# Patient Record
Sex: Female | Born: 1940 | Race: Black or African American | Hispanic: No | Smoking: Never smoker
Health system: Southern US, Community
[De-identification: ages and names within clinical notes are randomized; demographics above are authoritative.]

## PROBLEM LIST (undated history)

## (undated) DIAGNOSIS — I10 Essential (primary) hypertension: Secondary | ICD-10-CM

## (undated) DIAGNOSIS — N189 Chronic kidney disease, unspecified: Secondary | ICD-10-CM

## (undated) DIAGNOSIS — F32A Depression, unspecified: Secondary | ICD-10-CM

## (undated) DIAGNOSIS — M1990 Unspecified osteoarthritis, unspecified site: Secondary | ICD-10-CM

## (undated) DIAGNOSIS — E7800 Pure hypercholesterolemia, unspecified: Secondary | ICD-10-CM

## (undated) DIAGNOSIS — K219 Gastro-esophageal reflux disease without esophagitis: Secondary | ICD-10-CM

## (undated) DIAGNOSIS — J45909 Unspecified asthma, uncomplicated: Secondary | ICD-10-CM

## (undated) DIAGNOSIS — F329 Major depressive disorder, single episode, unspecified: Secondary | ICD-10-CM

## (undated) DIAGNOSIS — N2889 Other specified disorders of kidney and ureter: Secondary | ICD-10-CM

## (undated) DIAGNOSIS — C801 Malignant (primary) neoplasm, unspecified: Secondary | ICD-10-CM

---

## 2009-01-16 ENCOUNTER — Emergency Department (HOSPITAL_COMMUNITY): Admission: EM | Admit: 2009-01-16 | Discharge: 2009-01-16 | Payer: Self-pay | Admitting: Emergency Medicine

## 2009-01-28 ENCOUNTER — Emergency Department (HOSPITAL_COMMUNITY): Admission: EM | Admit: 2009-01-28 | Discharge: 2009-01-28 | Payer: Self-pay | Admitting: Emergency Medicine

## 2010-03-19 ENCOUNTER — Emergency Department (HOSPITAL_COMMUNITY): Admission: EM | Admit: 2010-03-19 | Discharge: 2010-03-19 | Payer: Self-pay | Admitting: Emergency Medicine

## 2010-09-08 ENCOUNTER — Encounter: Admission: RE | Admit: 2010-09-08 | Discharge: 2010-09-08 | Payer: Self-pay | Admitting: Family Medicine

## 2011-01-16 ENCOUNTER — Emergency Department (HOSPITAL_COMMUNITY)
Admission: EM | Admit: 2011-01-16 | Discharge: 2011-01-16 | Payer: Self-pay | Source: Home / Self Care | Admitting: Emergency Medicine

## 2011-03-15 LAB — URINE CULTURE

## 2011-03-15 LAB — URINALYSIS, ROUTINE W REFLEX MICROSCOPIC
Bilirubin Urine: NEGATIVE
Leukocytes, UA: NEGATIVE
Nitrite: NEGATIVE
Urobilinogen, UA: 0.2 mg/dL (ref 0.0–1.0)
pH: 7.5 (ref 5.0–8.0)

## 2011-03-15 LAB — POCT CARDIAC MARKERS
CKMB, poc: <1 ng/mL — ABNORMAL LOW (ref 1.0–8.0)
Myoglobin, poc: 53.5 ng/mL (ref 12–200)

## 2011-03-15 LAB — URINE MICROSCOPIC-ADD ON

## 2012-10-02 ENCOUNTER — Other Ambulatory Visit: Payer: Self-pay | Admitting: Family Medicine

## 2012-10-02 DIAGNOSIS — Z1231 Encounter for screening mammogram for malignant neoplasm of breast: Secondary | ICD-10-CM

## 2012-10-23 ENCOUNTER — Ambulatory Visit: Payer: Medicare Other

## 2012-10-27 ENCOUNTER — Emergency Department (INDEPENDENT_AMBULATORY_CARE_PROVIDER_SITE_OTHER)
Admission: EM | Admit: 2012-10-27 | Discharge: 2012-10-27 | Disposition: A | Discharge status: Nursing Home (Includes ICF) | Payer: Medicare Other | Source: Home / Self Care

## 2012-10-27 ENCOUNTER — Emergency Department (HOSPITAL_COMMUNITY): Payer: Medicare Other

## 2012-10-27 ENCOUNTER — Encounter (HOSPITAL_COMMUNITY): Payer: Self-pay | Admitting: Emergency Medicine

## 2012-10-27 ENCOUNTER — Emergency Department (HOSPITAL_COMMUNITY)
Admission: EM | Admit: 2012-10-27 | Discharge: 2012-10-27 | Disposition: A | Discharge status: Home / Self Care | Payer: Medicare Other | Attending: Emergency Medicine | Admitting: Emergency Medicine

## 2012-10-27 DIAGNOSIS — R209 Unspecified disturbances of skin sensation: Secondary | ICD-10-CM | POA: Insufficient documentation

## 2012-10-27 DIAGNOSIS — R0789 Other chest pain: Secondary | ICD-10-CM | POA: Insufficient documentation

## 2012-10-27 DIAGNOSIS — Z79899 Other long term (current) drug therapy: Secondary | ICD-10-CM | POA: Insufficient documentation

## 2012-10-27 DIAGNOSIS — R252 Cramp and spasm: Secondary | ICD-10-CM

## 2012-10-27 DIAGNOSIS — M79609 Pain in unspecified limb: Secondary | ICD-10-CM

## 2012-10-27 DIAGNOSIS — Z7982 Long term (current) use of aspirin: Secondary | ICD-10-CM | POA: Insufficient documentation

## 2012-10-27 DIAGNOSIS — M79601 Pain in right arm: Secondary | ICD-10-CM

## 2012-10-27 DIAGNOSIS — R079 Chest pain, unspecified: Secondary | ICD-10-CM

## 2012-10-27 LAB — CBC WITH DIFFERENTIAL/PLATELET
Basophils Absolute: 0 10*3/uL (ref 0.0–0.1)
Basophils Relative: 0 % (ref 0–1)
Eosinophils Absolute: 0.2 10*3/uL (ref 0.0–0.7)
Eosinophils Relative: 4 % (ref 0–5)
HCT: 37.4 % (ref 36.0–46.0)
Hemoglobin: 12.4 g/dL (ref 12.0–15.0)
Lymphocytes Relative: 50 % — ABNORMAL HIGH (ref 12–46)
Lymphs Abs: 2.7 10*3/uL (ref 0.7–4.0)
MCH: 28 pg (ref 26.0–34.0)
MCHC: 33.2 g/dL (ref 30.0–36.0)
MCV: 84.4 fL (ref 78.0–100.0)
Monocytes Absolute: 0.3 10*3/uL (ref 0.1–1.0)
Monocytes Relative: 5 % (ref 3–12)
Neutro Abs: 2.2 10*3/uL (ref 1.7–7.7)
Neutrophils Relative %: 42 % — ABNORMAL LOW (ref 43–77)
Platelets: 188 10*3/uL (ref 150–400)
RBC: 4.43 MIL/uL (ref 3.87–5.11)
RDW: 13.9 % (ref 11.5–15.5)
WBC: 5.4 10*3/uL (ref 4.0–10.5)

## 2012-10-27 LAB — POCT I-STAT, CHEM 8
HCT: 37 % (ref 36.0–46.0)
Hemoglobin: 12.6 g/dL (ref 12.0–15.0)
TCO2: 27 mmol/L (ref 0–100)

## 2012-10-27 LAB — COMPREHENSIVE METABOLIC PANEL
ALT: 16 U/L (ref 0–35)
Albumin: 4.2 g/dL (ref 3.5–5.2)
Chloride: 103 mEq/L (ref 96–112)
GFR calc Af Amer: >90 mL/min (ref >90)
GFR calc non Af Amer: 89 mL/min — ABNORMAL LOW (ref >90)
Glucose, Bld: 83 mg/dL (ref 70–99)
Sodium: 140 mEq/L (ref 135–145)
Total Protein: 8 g/dL (ref 6.0–8.3)

## 2012-10-27 LAB — POCT I-STAT TROPONIN I

## 2012-10-27 NOTE — ED Provider Notes (Signed)
History     CSN: [id number]  Arrival date & time 10/27/12  1047   None     Chief Complaint  Patient presents with  . Chest Pain    (Consider location/radiation/quality/duration/timing/severity/associated sxs/prior treatment) HPI Comments: 71 year old female with a history of hypertension and GERD he is complaining of numbness and cramping in her hands feet and legs. She also describes tightness, cramping and pain in her neck shoulders and axilla. She describes tightness in her mid and left anterior chest associated with shortness of breath and easy fatigability which are new symptoms for her. She is somewhat of a poor historian in that she seems to have cramping and almost all of her muscles and numbness in her extremities and finds it difficult to differentiate her chest discomfort, shortness of breath and exertional symptoms.   History reviewed. No pertinent past medical history.  History reviewed. No pertinent past surgical history.  No family history on file.  History  Substance Use Topics  . Smoking status: Never Smoker   . Smokeless tobacco: Not on file  . Alcohol Use: No    OB History    Grav Para Term Preterm Abortions TAB SAB Ect Mult Living                  Review of Systems  Constitutional: Positive for activity change and fatigue. Negative for fever.  HENT: Negative.   Eyes: Negative.   Respiratory: Positive for shortness of breath and wheezing.   Cardiovascular: Positive for chest pain. Negative for leg swelling.  Gastrointestinal: Negative for vomiting and blood in stool.  Musculoskeletal: Positive for myalgias. Negative for back pain.  Skin: Negative.   Neurological: Negative.     Allergies  Review of patient's allergies indicates no known allergies.  Home Medications   Current Outpatient Rx  Name  Route  Sig  Dispense  Refill  . ALBUTEROL SULFATE PO   Oral   Take by mouth.         . CYCLOBENZAPRINE HCL 5 MG PO TABS   Oral   Take 5 mg by  mouth 3 (three) times daily as needed.         Marland Kitchen FAMOTIDINE 20 MG PO TABS   Oral   Take 20 mg by mouth 2 (two) times daily.         Marland Kitchen DIOVAN PO   Oral   Take by mouth.           BP 137/77  Pulse 72  Temp 98.3 F (36.8 C) (Oral)  Resp 18  SpO2 100%  Physical Exam  Constitutional: She is oriented to person, place, and time. She appears well-developed and well-nourished. No distress.  HENT:  Head: Normocephalic and atraumatic.  Eyes: EOM are normal. Pupils are equal, round, and reactive to light.  Neck: Normal range of motion. Neck supple.  Cardiovascular: Normal rate, regular rhythm and normal heart sounds.   Pulmonary/Chest: Effort normal and breath sounds normal. No respiratory distress.       Bilateral expiratory wheezes but no crackles.  Abdominal: Soft. There is no tenderness.  Musculoskeletal: Normal range of motion.  Lymphadenopathy:    She has no cervical adenopathy.  Neurological: She is alert and oriented to person, place, and time. No cranial nerve deficit.  Skin: Skin is warm and dry.  Psychiatric: She has a normal mood and affect.    ED Course  Procedures (including critical care time)  Labs Reviewed - No data to display No results  found.   1. Chest pain   2. Paresthesia and pain of both upper extremities   3. Cramps, muscle, general       MDM  Will send to the emergency department by shuttle. She stable for now and has had symptoms for several days, up to 2 weeks. It is difficult to dissect her any complaints of paresthesias and cramping in association with tightness in the chest, shortness of breath and easy fatigability.         Hayden Rasmussen, NP 10/27/12 1220

## 2012-10-27 NOTE — ED Notes (Signed)
Pt c/o chest discomfort x2 days... Says the pain starts at [facility] and will radiates towards breast/chest area... Sx include: SOB... Also, c/o cramps/numbness of feet and hands x2 weeks... Denies: fevers, vomiting, nauseas, diarrhea, headaches, blurry visions, edema... Pt is alert w/no signs of distress.

## 2012-10-27 NOTE — ED Notes (Signed)
EKG performed at 1151 hours - EKG shows 1252 as time had not been changed from daylight savings time in machine.

## 2012-10-27 NOTE — ED Provider Notes (Signed)
History     CSN: [id number]  Arrival date & time 10/27/12  1244   First MD Initiated Contact with Patient 10/27/12 1706      No chief complaint on file.  chief complaint numbness,, chest pain, muscle cramps  (Consider location/radiation/quality/duration/timing/severity/associated sxs/prior treatment) HPI Patient complains of numbness in both hands and both feet for approximately 3 weeks complains of substernal chest pain constant for 3 weeks with a mild pleuritic component no shortness of breath no nausea no sweatiness has been seen by her primary care physician Dr.Glen the same complaint .she reports that she was told it is not her heart. Pain is nonexertional no treatment prior to coming here [facility] earlier today sent here for further evaluation. No other associated symptoms no treatment prior to coming here nothing makes symptoms better or worse No past medical history on file. Hypercholesterolemia hypertension No past surgical history on file. Tubal ligation No family history on file.  History  Substance Use Topics  . Smoking status: Never Smoker   . Smokeless tobacco: Not on file  . Alcohol Use: No    OB History    Grav Para Term Preterm Abortions TAB SAB Ect Mult Living                  Review of Systems  Cardiovascular: Positive for chest pain.  Neurological: Positive for numbness.  All other systems reviewed and are negative.    Allergies  Peanuts  Home Medications   Current Outpatient Rx  Name  Route  Sig  Dispense  Refill  . ACYCLOVIR 200 MG PO CAPS   Oral   Take 200 mg by mouth daily.         . ALBUTEROL SULFATE HFA 108 (90 BASE) MCG/ACT IN AERS   Inhalation   Inhale 2 puffs into the lungs every 6 (six) hours as needed. For shortness of breath/wheezing         . ASPIRIN EC 81 MG PO TBEC   Oral   Take 81 mg by mouth daily.         . ATORVASTATIN CALCIUM 40 MG PO TABS   Oral   Take 40 mg by mouth daily.        . CYCLOBENZAPRINE HCL 5 MG PO TABS   Oral   Take 5 mg by mouth daily as needed. For muscle spasms         . FAMOTIDINE 20 MG PO TABS   Oral   Take 20 mg by mouth daily.          Marland Kitchen VALSARTAN 160 MG PO TABS   Oral   Take 160 mg by mouth daily.           BP 132/73  Pulse 66  Temp 97.8 F (36.6 C) (Oral)  Resp 20  SpO2 97%  Physical Exam  Nursing note and vitals reviewed. Constitutional: She appears well-developed and well-nourished.  HENT:  Head: Normocephalic and atraumatic.  Eyes: Conjunctivae normal are normal. Pupils are equal, round, and reactive to light.  Neck: Neck supple. No tracheal deviation present. No thyromegaly present.  Cardiovascular: Normal rate and regular rhythm.   No murmur heard. Pulmonary/Chest: Effort normal and breath sounds normal.  Abdominal: Soft. Bowel sounds are normal. She exhibits no distension. There is no tenderness.  Musculoskeletal: Normal range of motion. She exhibits no edema and no tenderness.  Neurological: She is alert. Coordination normal.  Skin: Skin is warm and dry.  No rash noted.  Psychiatric: She has a normal mood and affect.    ED Course  Procedures (including critical care time)  Labs Reviewed  CBC WITH DIFFERENTIAL - Abnormal; Notable for the following:    Neutrophils Relative 42 (*)     Lymphocytes Relative 50 (*)     All other components within normal limits  COMPREHENSIVE METABOLIC PANEL - Abnormal; Notable for the following:    GFR calc non Af Amer 89 (*)     All other components within normal limits  POCT I-STAT TROPONIN I   Dg Chest 2 View  10/27/2012  *RADIOLOGY REPORT*  Clinical Data: Lateral chest pain  CHEST - 2 VIEW  Comparison: 01/16/2011  Findings: Heart size is normal.  Mediastinal shadows are normal. The lungs are clear except for mild scarring in the right middle lobe.  No infiltrate, mass, effusion or collapse.  No significant bony finding.  IMPRESSION: No active disease.  Mild scarring right  middle lobe.   Original Report Authenticated By: Paulina Fusi, M.D.      No diagnosis found.    Date: 10/27/2012  Rate: 55  Rhythm: sinus bradycardia  QRS Axis: normal  Intervals: normal  ST/T Wave abnormalities: nonspecific T wave changes  Conduction Disutrbances:none  Narrative Interpretation:   Old EKG Reviewed: EKG from today at 12:51 PM shows normal sinus rhythm 65 beats per minute otherwise unchanged from today's EKG  Results for orders placed during the hospital encounter of 10/27/12  CBC WITH DIFFERENTIAL      Component Value Range   WBC 5.4  4.0 - 10.5 K/uL   RBC 4.43  3.87 - 5.11 MIL/uL   Hemoglobin 12.4  12.0 - 15.0 g/dL   HCT 16.1  09.6 - 04.5 %   MCV 84.4  78.0 - 100.0 fL   MCH 28.0  26.0 - 34.0 pg   MCHC 33.2  30.0 - 36.0 g/dL   RDW 40.9  81.1 - 91.4 %   Platelets 188  150 - 400 K/uL   Neutrophils Relative 42 (*) 43 - 77 %   Neutro Abs 2.2  1.7 - 7.7 K/uL   Lymphocytes Relative 50 (*) 12 - 46 %   Lymphs Abs 2.7  0.7 - 4.0 K/uL   Monocytes Relative 5  3 - 12 %   Monocytes Absolute 0.3  0.1 - 1.0 K/uL   Eosinophils Relative 4  0 - 5 %   Eosinophils Absolute 0.2  0.0 - 0.7 K/uL   Basophils Relative 0  0 - 1 %   Basophils Absolute 0.0  0.0 - 0.1 K/uL  COMPREHENSIVE METABOLIC PANEL      Component Value Range   Sodium 140  135 - 145 mEq/L   Potassium 3.8  3.5 - 5.1 mEq/L   Chloride 103  96 - 112 mEq/L   CO2 29  19 - 32 mEq/L   Glucose, Bld 83  70 - 99 mg/dL   BUN 9  6 - 23 mg/dL   Creatinine, Ser 7.82  0.50 - 1.10 mg/dL   Calcium 95.6  8.4 - 21.3 mg/dL   Total Protein 8.0  6.0 - 8.3 g/dL   Albumin 4.2  3.5 - 5.2 g/dL   AST 25  0 - 37 U/L   ALT 16  0 - 35 U/L   Alkaline Phosphatase 80  39 - 117 U/L   Total Bilirubin 0.4  0.3 - 1.2 mg/dL   GFR calc non Af Amer 89 (*) >90  mL/min   GFR calc Af Amer >90  >90 mL/min  POCT I-STAT TROPONIN I      Component Value Range   Troponin i, poc 0.00  0.00 - 0.08 ng/mL   Comment 3           POCT I-STAT TROPONIN I       Component Value Range   Troponin i, poc 0.01  0.00 - 0.08 ng/mL   Comment 3            Dg Chest 2 View  10/27/2012  *RADIOLOGY REPORT*  Clinical Data: Lateral chest pain  CHEST - 2 VIEW  Comparison: 01/16/2011  Findings: Heart size is normal.  Mediastinal shadows are normal. The lungs are clear except for mild scarring in the right middle lobe.  No infiltrate, mass, effusion or collapse.  No significant bony finding.  IMPRESSION: No active disease.  Mild scarring right middle lobe.   Original Report Authenticated By: Paulina Fusi, M.D.    . 6:55 PM states "I feel well" MDM  Chest pain highly atypical for cardiac etiology. Has been constant for several weeks. Plan Tylenol as needed for pain followup Dr. Algernon Huxley DX is noncardiac chest pain        Doug Sou, MD 10/27/12 1902

## 2012-10-27 NOTE — ED Notes (Signed)
Cp x 2 days siffness in and numbness in hands and feet x 2 weeks  Sob  Get tired easy she staes  Went to [facility] and was sent here for further tests had ekg at [facility]

## 2012-10-28 NOTE — ED Provider Notes (Signed)
Medical screening examination/treatment/procedure(s) were performed by resident physician or non-physician practitioner and as supervising physician I was immediately available for consultation/collaboration.   KINDL,JAMES DOUGLAS MD.    James D Kindl, MD 10/28/12 0918 

## 2012-10-30 ENCOUNTER — Ambulatory Visit
Admission: RE | Admit: 2012-10-30 | Discharge: 2012-10-30 | Disposition: A | Discharge status: Home / Self Care | Payer: Medicare Other | Source: Ambulatory Visit | Attending: Family Medicine | Admitting: Family Medicine

## 2012-10-30 DIAGNOSIS — Z1231 Encounter for screening mammogram for malignant neoplasm of breast: Secondary | ICD-10-CM

## 2013-02-16 ENCOUNTER — Emergency Department (INDEPENDENT_AMBULATORY_CARE_PROVIDER_SITE_OTHER): Payer: Medicare Other

## 2013-02-16 ENCOUNTER — Emergency Department (INDEPENDENT_AMBULATORY_CARE_PROVIDER_SITE_OTHER)
Admission: EM | Admit: 2013-02-16 | Discharge: 2013-02-16 | Disposition: A | Discharge status: Home / Self Care | Payer: Medicaid Other | Source: Home / Self Care | Attending: Emergency Medicine | Admitting: Emergency Medicine

## 2013-02-16 ENCOUNTER — Encounter (HOSPITAL_COMMUNITY): Payer: Self-pay

## 2013-02-16 DIAGNOSIS — J45901 Unspecified asthma with (acute) exacerbation: Secondary | ICD-10-CM

## 2013-02-16 HISTORY — DX: Pure hypercholesterolemia, unspecified: E78.00

## 2013-02-16 HISTORY — DX: Unspecified asthma, uncomplicated: J45.909

## 2013-02-16 HISTORY — DX: Essential (primary) hypertension: I10

## 2013-02-16 MED ORDER — DEXAMETHASONE 4 MG PO TABS: 16.0000 mg | ORAL_TABLET | Freq: Once | ORAL | Status: DC

## 2013-02-16 MED ORDER — DEXAMETHASONE 4 MG PO TABS
ORAL_TABLET | ORAL | Status: AC
  Filled 2013-02-16: qty 4

## 2013-02-16 MED ORDER — DEXAMETHASONE 4 MG PO TABS
16.0000 mg | ORAL_TABLET | Freq: Once | ORAL | Status: AC
  Administered 2013-02-16: 16 mg via ORAL

## 2013-02-16 MED ORDER — ALBUTEROL SULFATE (5 MG/ML) 0.5% IN NEBU
INHALATION_SOLUTION | RESPIRATORY_TRACT | Status: AC
  Filled 2013-02-16: qty 1

## 2013-02-16 MED ORDER — ALBUTEROL SULFATE (5 MG/ML) 0.5% IN NEBU
5.0000 mg | INHALATION_SOLUTION | Freq: Once | RESPIRATORY_TRACT | Status: AC
  Administered 2013-02-16: 5 mg via RESPIRATORY_TRACT

## 2013-02-16 MED ORDER — IPRATROPIUM BROMIDE 0.02 % IN SOLN
0.5000 mg | Freq: Once | RESPIRATORY_TRACT | Status: AC
  Administered 2013-02-16: 0.5 mg via RESPIRATORY_TRACT

## 2013-02-16 NOTE — ED Provider Notes (Signed)
History     CSN: [id number]  Arrival date & time 02/16/13  1114   None     Chief Complaint  Patient presents with  . Shoulder Pain    (Consider location/radiation/quality/duration/timing/severity/associated sxs/prior treatment) Patient is a 72 y.o. female presenting with shoulder pain. The history is provided by the patient.  Shoulder Pain This is a new problem. Episode onset: 5 days ago. The problem occurs constantly. The problem has been gradually worsening. Associated symptoms include chest pain and shortness of breath. Exacerbated by: using arm/shoulder, activity/movement. Nothing relieves the symptoms.    Past Medical History  Diagnosis Date  . Asthma   . High cholesterol   . Hypertension     History reviewed. No pertinent past surgical history.  History reviewed. No pertinent family history.  History  Substance Use Topics  . Smoking status: Never Smoker   . Smokeless tobacco: Not on file  . Alcohol Use: No    OB History   Grav Para Term Preterm Abortions TAB SAB Ect Mult Living                  Review of Systems  Constitutional: Negative for fever and chills.  HENT: Negative for neck pain.   Respiratory: Positive for cough, shortness of breath and wheezing.   Cardiovascular: Positive for chest pain.  Musculoskeletal:       Shoulder and back pain on L side  Skin: Negative for color change and rash.    Allergies  Peanuts  Home Medications   Current Outpatient Rx  Name  Route  Sig  Dispense  Refill  . acyclovir (ZOVIRAX) 200 MG capsule   Oral   Take 200 mg by mouth daily.         Marland Kitchen albuterol (PROVENTIL HFA;VENTOLIN HFA) 108 (90 BASE) MCG/ACT inhaler   Inhalation   Inhale 2 puffs into the lungs every 6 (six) hours as needed. For shortness of breath/wheezing         . aspirin EC 81 MG tablet   Oral   Take 81 mg by mouth daily.         Marland Kitchen atorvastatin (LIPITOR) 40 MG tablet   Oral   Take 40 mg by mouth daily.         .  cyclobenzaprine (FLEXERIL) 5 MG tablet   Oral   Take 5 mg by mouth daily as needed. For muscle spasms         . dexamethasone (DECADRON) 4 MG tablet   Oral   Take 4 tablets (16 mg total) by mouth once. Take all 4 pills morning of 02/17/2013   4 tablet   0   . famotidine (PEPCID) 20 MG tablet   Oral   Take 20 mg by mouth daily.          . valsartan (DIOVAN) 160 MG tablet   Oral   Take 160 mg by mouth daily.           BP 136/80  Pulse 73  Temp(Src) 98.7 F (37.1 C) (Oral)  Resp 16  SpO2 98%  Physical Exam  Constitutional: She appears well-developed and well-nourished. No distress.  Cardiovascular: Normal rate and regular rhythm.   Pulmonary/Chest: No respiratory distress. She has wheezes.    Diffuse wheezing  Musculoskeletal:       Right shoulder: Normal.       Left shoulder: She exhibits tenderness. She exhibits normal range of motion, no bony tenderness, no swelling, no crepitus, no deformity and  no spasm.       Left elbow: Normal.       Cervical back: Normal.       Thoracic back: Normal.       Arms: Neurological: Gait normal.  Skin: Skin is warm, dry and intact. No rash noted.    ED Course  Procedures (including critical care time)  Labs Reviewed - No data to display Dg Chest 2 View  02/16/2013  *RADIOLOGY REPORT*  Clinical Data: Cough, chest/left shoulder pain  CHEST - 2 VIEW  Comparison: 10/27/2012  Findings: Lungs are essentially clear. No pleural effusion or pneumothorax.  Cardiomediastinal silhouette is within normal limits.  Mild degenerative changes of the visualized thoracolumbar spine.  IMPRESSION: No evidence of acute cardiopulmonary disease.   Original Report Authenticated By: Charline Bills, M.D.      1. Asthma exacerbation       MDM  Pt given albuterol and atrovent nebs and experienced significant reduction in original symptoms.  2nd dose of albuterol eliminated original pain symptoms, pt is now pain free.  Pt reports feeling completely  better, but still has wheezing on auscultation.  It seems pt is not using albuterol inhaler very often because she only can get one per month on medicaid, and can't afford to pay for it herself, so she uses it sparingly.  Discussed need for regular use of albuterol when having asthma symptoms. Pt to f/u with pcp next week. Given dose of dexamethasone here in [facility] and rx for 2nd dose tomorrow.         Cathlyn Parsons, NP 02/16/13 2018

## 2013-02-16 NOTE — ED Notes (Signed)
C/o pain in her left shoulder, left chest wall for several days; NAD , no changes in syx, no known injury; pain sometimes extends into her left side of neck; w/d/color god, NAD

## 2013-02-17 NOTE — ED Provider Notes (Signed)
Medical screening examination/treatment/procedure(s) were performed by resident physician or non-physician practitioner and as supervising physician I was immediately available for consultation/collaboration.   KINDL,JAMES DOUGLAS MD.   James D Kindl, MD 02/17/13 1117 

## 2013-09-26 ENCOUNTER — Emergency Department (INDEPENDENT_AMBULATORY_CARE_PROVIDER_SITE_OTHER)
Admission: EM | Admit: 2013-09-26 | Discharge: 2013-09-26 | Disposition: A | Discharge status: Home / Self Care | Payer: Medicare Other | Source: Home / Self Care | Attending: Emergency Medicine | Admitting: Emergency Medicine

## 2013-09-26 ENCOUNTER — Encounter (HOSPITAL_COMMUNITY): Payer: Self-pay | Admitting: Emergency Medicine

## 2013-09-26 DIAGNOSIS — R599 Enlarged lymph nodes, unspecified: Secondary | ICD-10-CM

## 2013-09-26 DIAGNOSIS — R591 Generalized enlarged lymph nodes: Secondary | ICD-10-CM

## 2013-09-26 DIAGNOSIS — G629 Polyneuropathy, unspecified: Secondary | ICD-10-CM

## 2013-09-26 DIAGNOSIS — R252 Cramp and spasm: Secondary | ICD-10-CM

## 2013-09-26 DIAGNOSIS — N3000 Acute cystitis without hematuria: Secondary | ICD-10-CM

## 2013-09-26 DIAGNOSIS — G589 Mononeuropathy, unspecified: Secondary | ICD-10-CM

## 2013-09-26 LAB — POCT I-STAT, CHEM 8
Calcium, Ion: 1.25 mmol/L (ref 1.13–1.30)
Creatinine, Ser: 0.8 mg/dL (ref 0.50–1.10)
Glucose, Bld: 95 mg/dL (ref 70–99)
Hemoglobin: 12.2 g/dL (ref 12.0–15.0)
Sodium: 141 mEq/L (ref 135–145)
TCO2: 29 mmol/L (ref 0–100)

## 2013-09-26 MED ORDER — CEPHALEXIN 500 MG PO CAPS: 500.0000 mg | ORAL_CAPSULE | Freq: Three times a day (TID) | ORAL | Status: DC

## 2013-09-26 MED ORDER — TRAMADOL HCL 50 MG PO TABS: 100.0000 mg | ORAL_TABLET | Freq: Three times a day (TID) | ORAL | Status: DC | PRN

## 2013-09-26 NOTE — ED Provider Notes (Signed)
Chief Complaint:   Chief Complaint  Patient presents with  . Urinary Frequency    History of Present Illness:   Regina [name] is a 72 year old female who comes in today with a multiplicity of complaints: A knot on her right neck, muscle cramps, excessive hunger and thirst, burning and stinging all over, and urinary symptoms. She notes a one-month history of a mildly tender lump at the hairline in her right occipital area. This is tender to touch. She has not had any lesions of the scalp, neck, or head. She denies any lymphadenopathy anywhere else. She's had no intraoral lesions, nasal congestion, drainage, fever, chills, sweats, or weight loss. She also notes a long-standing history of intermittent cramps in her extremities. These can come on with no precipitating factors at all. They come on day and night. She also notes excessive thirst and excessive hunger. She's not lost any weight. She denies any swelling of extremities, numbness, tingling, or weakness. She does note burning and stinging all over from her head to her toe. She feels like there is something under her skin. For the past 2 weeks she's had frequent urination, dysuria, and urinary order. She denies any blood in the urine, lower bowel or lower back pain, nausea or vomiting. The patient thinks she had a urinary tract infection a long time ago.  Review of Systems:  Other than noted above, the patient denies any of the following symptoms. Systemic:  No fever, chills, sweats, fatigue, myalgias, headache, or anorexia. Eye:  No redness, pain or drainage. ENT:  No earache, nasal congestion, rhinorrhea, sinus pressure, or sore throat. Lungs:  No cough, sputum production, wheezing, shortness of breath.  Cardiovascular:  No chest pain, palpitations, or syncope. GI:  No nausea, vomiting, abdominal pain or diarrhea. GU:  No dysuria, frequency, or hematuria. Skin:  No rash or pruritis.  PMFSH:  Past medical history, family history, social  history, meds, and allergies were reviewed.  She's followed by Dr. Parke Simmers. She has no medication allergies. She has high blood pressure, elevated cholesterol, asthma, osteopenia. She takes albuterol, aspirin, a Darvocet and, cyclobenzaprine, Pepcid, and Diovan.  Physical Exam:   Vital signs:  BP 137/64  Pulse 68  Temp(Src) 98.7 F (37.1 C) (Oral)  Resp 18  SpO2 100% General:  Alert, in no distress. Eye:  PERRL, full EOMs.  Lids and conjunctivas were normal. ENT:  TMs and canals were normal, without erythema or inflammation.  Nasal mucosa was clear and uncongested, without drainage.  Mucous membranes were moist.  Pharynx was clear, without exudate or drainage.  There were no oral ulcerations or lesions. Neck:  Supple, there was a 8 mm, soft, flat, mildly tender lymph node at the hairline in the right occipital area. No lesions were noted on his scalp, head, her face. No lymphadenopathy in any of the other lymph node areas. Lungs:  No respiratory distress.  Lungs were clear to auscultation, without wheezes, rales or rhonchi.  Breath sounds were clear and equal bilaterally. Heart:  Regular rhythm, without gallops, murmers or rubs. Abdomen:  Soft, flat, and non-tender to palpation.  No hepatosplenomagaly or mass. Skin:  Clear, warm, and dry, without rash or lesions.  Labs:   Results for orders placed during the hospital encounter of 09/26/13  POCT I-STAT, CHEM 8      Result Value Range   Sodium 141  135 - 145 mEq/L   Potassium 3.7  3.5 - 5.1 mEq/L   Chloride 101  96 - 112  mEq/L   BUN 13  6 - 23 mg/dL   Creatinine, Ser 9.14  0.50 - 1.10 mg/dL   Glucose, Bld 95  70 - 99 mg/dL   Calcium, Ion 7.82  9.56 - 1.30 mmol/L   TCO2 29  0 - 100 mmol/L   Hemoglobin 12.2  12.0 - 15.0 g/dL   HCT 21.3  08.6 - 57.8 %    A urinalysis was obtained which shows trace hemoglobin. Her urine was cultured.  Assessment:  The primary encounter diagnosis was Acute cystitis. Diagnoses of Muscle cramps, Neuropathy,  and Lymphadenopathy were also pertinent to this visit.  She may have acute cystitis, although her urine is not very striking today. A culture is pending. We'll go ahead and treat with cephalexin. The knot on her neck appears to be a reactive lymph node. We'll treat with antibiotics and have her followup with Dr. Annalee Genta. No obvious cause for the cramping and other symptoms that she's having. Needs followup with her primary care physician.  Plan:   1.  Meds:  The following meds were prescribed:   Discharge Medication List as of 09/26/2013  2:23 PM    START taking these medications   Details  cephALEXin (KEFLEX) 500 MG capsule Take 1 capsule (500 mg total) by mouth 3 (three) times daily., Starting 09/26/2013, Until Discontinued, Normal    traMADol (ULTRAM) 50 MG tablet Take 2 tablets (100 mg total) by mouth every 8 (eight) hours as needed for pain., Starting 09/26/2013, Until Discontinued, Normal        2.  Patient Education/Counseling:  The patient was given appropriate handouts, self care instructions, and instructed in symptomatic relief.   3.  Follow up:  The patient was told to follow up if no better in 3 to 4 days, if becoming worse in any way, and given some red flag symptoms such as fever, persistent vomiting, or severe pain which would prompt immediate return.  Follow up with Dr. Annalee Genta for the lymphadenopathy and with Dr. Parke Simmers for all of her other symptoms.         Reuben Likes, MD 09/26/13 [phone]

## 2013-09-26 NOTE — ED Notes (Signed)
Chart review.

## 2013-09-26 NOTE — ED Notes (Signed)
Pt  Has  Multiple   Complaints       To  Include  Frequent  Urination         pain r  Arm   And  r  Side  With  sm   Knots  On back of  Head    In the   Scalp  Line area         She  Ambulated to  Room    With a  Steady  Fluid  Gait   Sitting upright  On exam table speaking in  Complete  sentances

## 2013-09-27 ENCOUNTER — Other Ambulatory Visit: Payer: Self-pay

## 2013-09-27 DIAGNOSIS — Z1231 Encounter for screening mammogram for malignant neoplasm of breast: Secondary | ICD-10-CM

## 2013-09-27 LAB — POCT URINALYSIS DIP (DEVICE)
Bilirubin Urine: NEGATIVE
Leukocytes, UA: NEGATIVE
Nitrite: NEGATIVE
Protein, ur: NEGATIVE mg/dL
Urobilinogen, UA: 0.2 mg/dL (ref 0.0–1.0)
pH: 8.5 — ABNORMAL HIGH (ref 5.0–8.0)

## 2013-09-27 LAB — URINE CULTURE: Culture: NO GROWTH

## 2013-11-01 ENCOUNTER — Ambulatory Visit
Admission: RE | Admit: 2013-11-01 | Discharge: 2013-11-01 | Disposition: A | Discharge status: Home / Self Care | Payer: Medicare Other | Source: Ambulatory Visit

## 2013-11-01 DIAGNOSIS — Z1231 Encounter for screening mammogram for malignant neoplasm of breast: Secondary | ICD-10-CM

## 2014-01-02 ENCOUNTER — Ambulatory Visit: Payer: Medicare Other

## 2014-01-11 ENCOUNTER — Ambulatory Visit: Payer: Medicare Other | Admitting: Internal Medicine

## 2014-03-01 ENCOUNTER — Ambulatory Visit: Payer: Medicare Other | Attending: Internal Medicine | Admitting: Internal Medicine

## 2014-03-01 ENCOUNTER — Encounter: Payer: Self-pay | Admitting: Internal Medicine

## 2014-03-01 VITALS — BP 128/74 | HR 77 | Temp 98.0°F | Resp 17 | Ht 62.0 in | Wt 151.0 lb

## 2014-03-01 DIAGNOSIS — E785 Hyperlipidemia, unspecified: Secondary | ICD-10-CM

## 2014-03-01 DIAGNOSIS — Z7982 Long term (current) use of aspirin: Secondary | ICD-10-CM | POA: Insufficient documentation

## 2014-03-01 DIAGNOSIS — E7800 Pure hypercholesterolemia, unspecified: Secondary | ICD-10-CM | POA: Insufficient documentation

## 2014-03-01 DIAGNOSIS — M8580 Other specified disorders of bone density and structure, unspecified site: Secondary | ICD-10-CM

## 2014-03-01 DIAGNOSIS — D649 Anemia, unspecified: Secondary | ICD-10-CM

## 2014-03-01 DIAGNOSIS — M949 Disorder of cartilage, unspecified: Secondary | ICD-10-CM

## 2014-03-01 DIAGNOSIS — M899 Disorder of bone, unspecified: Secondary | ICD-10-CM

## 2014-03-01 DIAGNOSIS — Z139 Encounter for screening, unspecified: Secondary | ICD-10-CM

## 2014-03-01 DIAGNOSIS — I10 Essential (primary) hypertension: Secondary | ICD-10-CM

## 2014-03-01 DIAGNOSIS — Z1211 Encounter for screening for malignant neoplasm of colon: Secondary | ICD-10-CM

## 2014-03-01 DIAGNOSIS — J45909 Unspecified asthma, uncomplicated: Secondary | ICD-10-CM

## 2014-03-01 DIAGNOSIS — Z8619 Personal history of other infectious and parasitic diseases: Secondary | ICD-10-CM

## 2014-03-01 LAB — ANEMIA PANEL
%SAT: 16 % — ABNORMAL LOW (ref 20–55)
ABS RETIC: 40.6 10*3/uL (ref 19.0–186.0)
Ferritin: 192 ng/mL (ref 10–291)
Folate: >20 ng/mL
IRON: 49 ug/dL (ref 42–145)
RBC.: 4.51 MIL/uL (ref 3.87–5.11)
RETIC CT PCT: 0.9 % (ref 0.4–2.3)
TIBC: 309 ug/dL (ref 250–470)
UIBC: 260 ug/dL (ref 125–400)
Vitamin B-12: 562 pg/mL (ref 211–911)

## 2014-03-01 MED ORDER — ACYCLOVIR 200 MG PO CAPS: 200.0000 mg | ORAL_CAPSULE | Freq: Every day | ORAL | Status: DC

## 2014-03-01 MED ORDER — ATORVASTATIN CALCIUM 40 MG PO TABS: 40.0000 mg | ORAL_TABLET | Freq: Every day | ORAL | Status: DC

## 2014-03-01 NOTE — Progress Notes (Signed)
Patient Demographics  Regina [name], is a 73 y.o. female  JWJ:[id number]  [id number]:[id number]  DOB - 04-Feb-1941  CC:  Chief Complaint  Patient presents with  . Establish Care       HPI: Regina [name] is a 73 y.o. female here today to establish medical care. Patient has history of hypertension, asthma, osteopenia she is taking vitamin D and calcium supplement, as per patient she was prescribed Fosamax in the past but her insurance did not cover, as per patient she also history of anemia since childhood, she denies any bleeding, 10 years ago she had colonoscopy done, also history of hyperlipidemia has not been taking her Lipitor for the last few months. Patient is requesting refill on the medications, history of herpes as per patient she was prescribed acyclovir and needs a refill on the medication.  Patient has No headache, No chest pain, No abdominal pain - No Nausea, No new weakness tingling or numbness, No Cough - SOB.  Allergies  Allergen Reactions  . Peanuts [Peanut Oil]    Past Medical History  Diagnosis Date  . Asthma   . High cholesterol   . Hypertension    Current Outpatient Prescriptions on File Prior to Visit  Medication Sig Dispense Refill  . albuterol (PROVENTIL HFA;VENTOLIN HFA) 108 (90 BASE) MCG/ACT inhaler Inhale 2 puffs into the lungs every 6 (six) hours as needed. For shortness of breath/wheezing      . aspirin EC 81 MG tablet Take 81 mg by mouth daily.      . cyclobenzaprine (FLEXERIL) 5 MG tablet Take 5 mg by mouth daily as needed. For muscle spasms      . dexamethasone (DECADRON) 4 MG tablet Take 4 tablets (16 mg total) by mouth once. Take all 4 pills morning of 02/17/2013  4 tablet  0  . famotidine (PEPCID) 20 MG tablet Take 20 mg by mouth daily.       . traMADol (ULTRAM) 50 MG tablet Take 2 tablets (100 mg total) by mouth every 8 (eight) hours as needed for pain.  30 tablet  0  . valsartan (DIOVAN) 160 MG tablet Take 160 mg by mouth daily.      . cephALEXin  (KEFLEX) 500 MG capsule Take 1 capsule (500 mg total) by mouth 3 (three) times daily.  30 capsule  0   No current facility-administered medications on file prior to visit.   Family History  Problem Relation Age of Onset  . Heart disease Maternal Grandmother   . Asthma Maternal Grandfather   . Heart disease Maternal Grandfather   . Cancer Mother   . Diabetes Father    History   Social History  . Marital Status: Single    Spouse Name: N/A    Number of Children: N/A  . Years of Education: N/A   Occupational History  . Not on file.   Social History Main Topics  . Smoking status: Never Smoker   . Smokeless tobacco: Not on file  . Alcohol Use: No  . Drug Use: No  . Sexual Activity: Not on file   Other Topics Concern  . Not on file   Social History Narrative  . No narrative on file    Review of Systems: Constitutional: Negative for fever, chills, diaphoresis, activity change, appetite change and fatigue. HENT: Negative for ear pain, nosebleeds, congestion, facial swelling, rhinorrhea, neck pain, neck stiffness and ear discharge.  Eyes: Negative for pain, discharge, redness, itching and visual disturbance. Respiratory: Negative for  cough, choking, chest tightness, shortness of breath, wheezing and stridor.  Cardiovascular: Negative for chest pain, palpitations and leg swelling. Gastrointestinal: Negative for abdominal distention. Genitourinary: Negative for dysuria, urgency, frequency, hematuria, flank pain, decreased urine volume, difficulty urinating and dyspareunia.  Musculoskeletal: Negative for back pain, joint swelling, arthralgia and gait problem. Neurological: Negative for dizziness, tremors, seizures, syncope, facial asymmetry, speech difficulty, weakness, light-headedness, numbness and headaches.  Hematological: Negative for adenopathy. Does not bruise/bleed easily. Psychiatric/Behavioral: Negative for hallucinations, behavioral problems, confusion, dysphoric mood,  decreased concentration and agitation.    Objective:   Filed Vitals:   03/01/14 1059  BP: 128/74  Pulse: 77  Temp: 98 F (36.7 C)  Resp: 17    Physical Exam: Constitutional: Patient appears well-developed and well-nourished. No distress. HENT: Normocephalic, atraumatic, External right and left ear normal. Oropharynx is clear and moist.  Eyes: Conjunctivae and EOM are normal. PERRLA, no scleral icterus. Neck: Normal ROM. Neck supple. No JVD. No tracheal deviation. No thyromegaly. CVS: RRR, S1/S2 +, no murmurs, no gallops, no carotid bruit.  Pulmonary: Effort and breath sounds normal, no stridor, rhonchi, wheezes, rales.  Abdominal: Soft. BS +, no distension, tenderness, rebound or guarding.  Musculoskeletal: Normal range of motion. No edema and no tenderness.  Neuro: Alert. Normal reflexes, muscle tone coordination. No cranial nerve deficit. Skin: Skin is warm and dry. No rash noted. Not diaphoretic. No erythema. No pallor. Psychiatric: Normal mood and affect. Behavior, judgment, thought content normal.  Lab Results  Component Value Date   WBC 5.4 10/27/2012   HGB 12.2 09/26/2013   HCT 36.0 09/26/2013   MCV 84.4 10/27/2012   PLT 188 10/27/2012   Lab Results  Component Value Date   CREATININE 0.80 09/26/2013   BUN 13 09/26/2013   NA 141 09/26/2013   K 3.7 09/26/2013   CL 101 09/26/2013   CO2 29 10/27/2012    No results found for this basename: HGBA1C   Lipid Panel  No results found for this basename: chol, trig, hdl, cholhdl, vldl, ldlcalc       Assessment and plan:   1. Essential hypertension, benign  - COMPLETE METABOLIC PANEL WITH GFR; Future - TSH; Future Continue Diovan 160 mg daily  2. Osteopenia Recheck bone density. Patient currently taking calcium and vitamin D supplement. - DG Bone Density; Future  3. Anemia Will check anemia panel - Anemia panel  4. Other and unspecified hyperlipidemia  - Lipid panel; Future - atorvastatin (LIPITOR) 40 MG tablet;  Take 1 tablet (40 mg total) by mouth daily.  Dispense: 30 tablet; Refill: 3  5. Unspecified asthma(493.90) Continue with albuterol when necessary  6. Special screening for malignant neoplasms, colon  - Ambulatory referral to Gastroenterology  8. History of herpes labialis  - acyclovir (ZOVIRAX) 200 MG capsule; Take 1 capsule (200 mg total) by mouth daily.  Dispense: 30 capsule; Refill: 0   Return in about 6 weeks (around 04/12/2014).   Lorayne Marek, MD

## 2014-03-01 NOTE — Progress Notes (Signed)
Pt is here to establish care. Pt has a history of asthma and osteoporosis.

## 2014-03-14 ENCOUNTER — Ambulatory Visit
Admission: RE | Admit: 2014-03-14 | Discharge: 2014-03-14 | Disposition: A | Discharge status: Home / Self Care | Payer: Medicare Other | Source: Ambulatory Visit | Attending: Internal Medicine | Admitting: Internal Medicine

## 2014-03-14 DIAGNOSIS — M8580 Other specified disorders of bone density and structure, unspecified site: Secondary | ICD-10-CM

## 2014-03-18 ENCOUNTER — Telehealth: Payer: Self-pay | Admitting: Internal Medicine

## 2014-03-18 NOTE — Telephone Encounter (Signed)
Pt called regarding a refill of her Blood pressure medication, please contact pt

## 2014-03-22 ENCOUNTER — Telehealth: Payer: Self-pay

## 2014-03-22 MED ORDER — VALSARTAN 160 MG PO TABS: 160.0000 mg | ORAL_TABLET | Freq: Every day | ORAL | Status: DC

## 2014-03-22 NOTE — Telephone Encounter (Signed)
Patient called needing a refill on her diovan Prescription sent to [facility] on [address]

## 2014-03-22 NOTE — Telephone Encounter (Signed)
Pt calling again regarding refill for meds, pt says she can feel skin crawling because of not taking bp meds. For this refill pt prefers using [facility] on [address] because it is closer to her.

## 2014-03-25 ENCOUNTER — Emergency Department (INDEPENDENT_AMBULATORY_CARE_PROVIDER_SITE_OTHER)
Admission: EM | Admit: 2014-03-25 | Discharge: 2014-03-25 | Disposition: A | Discharge status: Home / Self Care | Payer: Medicare Other | Source: Home / Self Care | Attending: Emergency Medicine | Admitting: Emergency Medicine

## 2014-03-25 ENCOUNTER — Encounter (HOSPITAL_COMMUNITY): Payer: Self-pay | Admitting: Emergency Medicine

## 2014-03-25 DIAGNOSIS — R591 Generalized enlarged lymph nodes: Secondary | ICD-10-CM

## 2014-03-25 DIAGNOSIS — R350 Frequency of micturition: Secondary | ICD-10-CM

## 2014-03-25 DIAGNOSIS — R599 Enlarged lymph nodes, unspecified: Secondary | ICD-10-CM

## 2014-03-25 LAB — POCT URINALYSIS DIP (DEVICE)
Bilirubin Urine: NEGATIVE
Glucose, UA: NEGATIVE mg/dL
Ketones, ur: NEGATIVE mg/dL
Leukocytes, UA: NEGATIVE
Nitrite: NEGATIVE
PH: 8.5 — AB (ref 5.0–8.0)
PROTEIN: NEGATIVE mg/dL
SPECIFIC GRAVITY, URINE: 1.015 (ref 1.005–1.030)
UROBILINOGEN UA: 1 mg/dL (ref 0.0–1.0)

## 2014-03-25 LAB — GLUCOSE, CAPILLARY: Glucose-Capillary: 65 mg/dL — ABNORMAL LOW (ref 70–99)

## 2014-03-25 NOTE — ED Provider Notes (Signed)
Medical screening examination/treatment/procedure(s) were performed by non-physician practitioner and as supervising physician I was immediately available for consultation/collaboration.  Philipp Deputy, M.D.  Harden Mo, MD 03/25/14 1556

## 2014-03-25 NOTE — ED Notes (Signed)
Pt c/o urinary frequency since Friday.   No otc meds taken for symptoms.   Pt also request to have blood sugar check.

## 2014-03-25 NOTE — ED Provider Notes (Signed)
CSN: [id number]     Arrival date & time 03/25/14  1156 History   First MD Initiated Contact with Patient 03/25/14 1350     Chief Complaint  Patient presents with  . Urinary Tract Infection  . Diabetes    request sugar check.  pt is fasting.   (Consider location/radiation/quality/duration/timing/severity/associated sxs/prior Treatment) HPI Comments: 73 year old female presents with multiple complaints. These include urinary frequency, a persistently enlarged lymph node on the right posterior part of her neck, intermittent warmth in her arms, a feeling of something crawling on her legs. These symptoms have all been going on since sometime last year. She cannot clarify whether conditions have worsened recently. She has seen her primary care this but she says that they do not care. She also mentioned something about the word borderline but I cannot get her to focus. She denies fever, flank pain, NVD   Past Medical History  Diagnosis Date  . Asthma   . High cholesterol   . Hypertension    History reviewed. No pertinent past surgical history. Family History  Problem Relation Age of Onset  . Heart disease Maternal Grandmother   . Asthma Maternal Grandfather   . Heart disease Maternal Grandfather   . Cancer Mother   . Diabetes Father    History  Substance Use Topics  . Smoking status: Never Smoker   . Smokeless tobacco: Not on file  . Alcohol Use: No   OB History   Grav Para Term Preterm Abortions TAB SAB Ect Mult Living                 Review of Systems  Genitourinary: Positive for frequency.  Skin:       See history of present illness regarding her skin complaints  Hematological: Positive for adenopathy.  All other systems reviewed and are negative.    Allergies  Peanuts  Home Medications   Current Outpatient Rx  Name  Route  Sig  Dispense  Refill  . acyclovir (ZOVIRAX) 200 MG capsule   Oral   Take 1 capsule (200 mg total) by mouth daily.   30 capsule   0   .  albuterol (PROVENTIL HFA;VENTOLIN HFA) 108 (90 BASE) MCG/ACT inhaler   Inhalation   Inhale 2 puffs into the lungs every 6 (six) hours as needed. For shortness of breath/wheezing         . aspirin EC 81 MG tablet   Oral   Take 81 mg by mouth daily.         Marland Kitchen atorvastatin (LIPITOR) 40 MG tablet   Oral   Take 1 tablet (40 mg total) by mouth daily.   30 tablet   3   . famotidine (PEPCID) 20 MG tablet   Oral   Take 20 mg by mouth daily.          . valsartan (DIOVAN) 160 MG tablet   Oral   Take 1 tablet (160 mg total) by mouth daily.   30 tablet   0   . cephALEXin (KEFLEX) 500 MG capsule   Oral   Take 1 capsule (500 mg total) by mouth 3 (three) times daily.   30 capsule   0   . cyclobenzaprine (FLEXERIL) 5 MG tablet   Oral   Take 5 mg by mouth daily as needed. For muscle spasms         . dexamethasone (DECADRON) 4 MG tablet   Oral   Take 4 tablets (16 mg total) by mouth once.  Take all 4 pills morning of 02/17/2013   4 tablet   0   . traMADol (ULTRAM) 50 MG tablet   Oral   Take 2 tablets (100 mg total) by mouth every 8 (eight) hours as needed for pain.   30 tablet   0    BP 131/76  Pulse 61  Temp(Src) 97.8 F (36.6 C) (Oral)  Resp 16  SpO2 100% Physical Exam  Nursing note and vitals reviewed. Constitutional: She is oriented to person, place, and time. Vital signs are normal. She appears well-developed and well-nourished. No distress.  HENT:  Head: Normocephalic and atraumatic.  Pulmonary/Chest: Effort normal. No respiratory distress.  Abdominal: Soft. There is no tenderness.  Lymphadenopathy:  Nodule on the right posterior neck, lymphadenopathy versus dermoid cyst, nontender  Neurological: She is alert and oriented to person, place, and time. She has normal strength. Coordination normal.  Skin: Skin is warm and dry. No rash noted. She is not diaphoretic.  Psychiatric: She has a normal mood and affect. Judgment and thought content normal. Her speech is  rapid and/or pressured and tangential. She is agitated.    ED Course  Procedures (including critical care time) Labs Review Labs Reviewed  GLUCOSE, CAPILLARY - Abnormal; Notable for the following:    Glucose-Capillary 65 (*)    All other components within normal limits  POCT URINALYSIS DIP (DEVICE) - Abnormal; Notable for the following:    Hgb urine dipstick TRACE (*)    pH 8.5 (*)    All other components within normal limits  URINE CULTURE   Imaging Review No results found.   MDM   1. Urinary frequency   2. Lymphadenopathy    Physical exam is normal. She tells me that she has a history of overactive bladder and has had urinary frequency since she stopped her medicine. I referred her back to primary care for evaluation of overactive bladder. With regard to her abnormal psychiatric presentation, I referred her to geriatric psychiatry for evaluation. I've also sent a note in the electronic medical record to her primary care physician requesting that they evaluate her for this lymph node which will probably consist of imaging and possibly biopsy.     Liam Graham, PA-C 03/25/14 [phone]

## 2014-03-25 NOTE — Discharge Instructions (Signed)
Swollen Lymph Nodes The lymphatic system filters fluid from around cells. It is like a system of blood vessels. These channels carry lymph instead of blood. The lymphatic system is an important part of the immune (disease fighting) system. When people talk about "swollen glands in the neck," they are usually talking about swollen lymph nodes. The lymph nodes are like the little traps for infection. You and your caregiver may be able to feel lymph nodes, especially swollen nodes, in these common areas: the groin (inguinal area), armpits (axilla), and above the clavicle (supraclavicular). You may also feel them in the neck (cervical) and the back of the head just above the hairline (occipital). Swollen glands occur when there is any condition in which the body responds with an allergic type of reaction. For instance, the glands in the neck can become swollen from insect bites or any type of minor infection on the head. These are very noticeable in children with only minor problems. Lymph nodes may also become swollen when there is a tumor or problem with the lymphatic system, such as Hodgkin's disease. TREATMENT   Most swollen glands do not require treatment. They can be observed (watched) for a short period of time, if your caregiver feels it is necessary. Most of the time, observation is not necessary.  Antibiotics (medicines that kill germs) may be prescribed by your caregiver. Your caregiver may prescribe these if he or she feels the swollen glands are due to a bacterial (germ) infection. Antibiotics are not used if the swollen glands are caused by a virus. HOME CARE INSTRUCTIONS   Take medications as directed by your caregiver. Only take over-the-counter or prescription medicines for pain, discomfort, or fever as directed by your caregiver. SEEK MEDICAL CARE IF:   If you begin to run a temperature greater than 102 F (38.9 C), or as your caregiver suggests. MAKE SURE YOU:   Understand these  instructions.  Will watch your condition.  Will get help right away if you are not doing well or get worse. Document Released: 11/26/2002 Document Revised: 02/28/2012 Document Reviewed: 12/06/2005 [facility] Patient Information 2014 [place].  Urinary Frequency The number of times a normal person urinates depends upon how much liquid they take in and how much liquid they are losing. If the temperature is hot and there is high humidity then the person will sweat more and usually breathe a little more frequently. These factors decrease the amount of frequency of urination that would be considered normal. The amount you drink is easily determined, but the amount of fluid lost is sometimes more difficult to calculate.  Fluid is lost in two ways:  Sensible fluid loss is usually measured by the amount of urine that you get rid of. Losses of fluid can also occur with diarrhea.  Insensible fluid loss is more difficult to measure. It is caused by evaporation. Insensible loss of fluid occurs through breathing and sweating. It usually ranges from a little less than a quart to a little more than a quart of fluid a day. In normal temperatures and activity levels the average person may urinate 4 to 7 times in a 24-hour period. Needing to urinate more often than that could indicate a problem. If one urinates 4 to 7 times in 24 hours and has large volumes each time, that could indicate a different problem from one who urinates 4 to 7 times a day and has small volumes. The time of urinating is also an important. Most urinating should be  done during the waking hours. Getting up at night to urinate frequently can indicate some problems. CAUSES  The bladder is the organ in your lower abdomen that holds urine. Like a balloon, it swells some as it fills up. Your nerves sense this and tell you it is time to head for the bathroom. There are a number of reasons that you might feel the need to urinate more often than  usual. They include:  Urinary tract infection. This is usually associated with other signs such as burning when you urinate.  In men, problems with the prostate (a walnut-size gland that is located near the tube that carries urine out of your body). There are two reasons why the prostate can cause an increased frequency of urination:  An enlarged prostate that does not let the bladder empty well. If the bladder only half empties when you urinate then it only has half the capacity to fill before you have to urinate again.  The nerves in the bladder become more hypersensitive with an increased size of the prostate even if the bladder empties completely.  Pregnancy.  Obesity. Excess weight is more likely to cause a problem for women more than for men.  Bladder stones or other bladder problems.  Caffeine.  Alcohol.  Medications. For example, drugs that help the body get rid of extra fluid (diuretics) increase urine production. Some other medicines must be taken with lots of fluids.  Muscle or nerve weakness. This might be the result of a spinal cord injury, a stroke, multiple sclerosis or Parkinson's disease.  Long-standing diabetes can decrease the sensation of the bladder. This loss of sensation makes it harder to sense the bladder needs to be emptied. Over a period of years the bladder is stretched out by constant overfilling. This weakens the bladder muscles so that the bladder does not empty well and has less capacity to fill with new urine.  Interstitial cystitis (also called painful bladder syndrome). This condition develops because the tissues that line the insider of the bladder are inflamed (inflammation is the body's way of reacting to injury or infection). It causes pain and frequent urination. It occurs in women more often than in men. DIAGNOSIS   To decide what might be causing your urinary frequency, your healthcare provider will probably:  Ask about symptoms you have  noticed.  Ask about your overall health. This will include questions about any medications you are taking.  Do a physical examination.  Order some tests. These might include:  A blood test to check for diabetes or other health issues that could be contributing to the problem.  Urine testing. This could measure the flow of urine and the pressure on the bladder.  A test of your neurological system (the brain, spinal cord and nerves). This is the system that senses the need to urinate.  A bladder test to check whether it is emptying completely when you urinate.  Cytoscopy. This test uses a thin tube with a tiny camera on it. It offers a look inside your urethra and bladder to see if there are problems.  Imaging tests. You might be given a contrast dye and then asked to urinate. X-rays are taken to see how your bladder is working. TREATMENT  It is important for you to be evaluated to determine if the amount or frequency that you have is unusual or abnormal. If it is found to be abnormal the cause should be determined and this can usually be found out easily.  Depending upon the cause treatment could include medication, stimulation of the nerves, or surgery. There are not too many things that you can do as an individual to change your urinary frequency. It is important that you balance the amount of fluid intake needed to compensate for your activity and the temperature. Medical problems will be diagnosed and taken care of by your physician. There is no particular bladder training such as Kegel's exercises that you can do to help urinary frequency. This is an exercise this is usually done for people who have leaking of urine when they laugh cough or sneeze. HOME CARE INSTRUCTIONS   Take any medications your healthcare provider prescribed or suggested. Follow the directions carefully.  Practice any lifestyle changes that are recommended. These might include:  Drinking less fluid or drinking at  different times of the day. If you need to urinate often during the night, for example, you may need to stop drinking fluids early in the evening.  Cutting down on caffeine or alcohol. They both can make you need to urinate more often than normal. Caffeine is found in coffee, tea and sodas.  Losing weight, if that is recommended.  Keep a journal or a log. You might be asked to record how much you drink and when and when you feel the need to urinate. This will also help evaluate how well the treatment provided by your physician is working. SEEK MEDICAL CARE IF:   Your need to urinate often gets worse.  You feel increased pain or irritation when you urinate.  You notice blood in your urine.  You have questions about any medications that your healthcare provider recommended.  You notice blood, pus or swelling at the site of any test or treatment procedure.  You develop a fever of more than 100.5 F (38.1 C). SEEK IMMEDIATE MEDICAL CARE IF:  You develop a fever of more than 102.0 F (38.9 C). Document Released: 10/02/2009 Document Revised: 02/28/2012 Document Reviewed: 10/02/2009 [facility] Patient Information 2014 [place].

## 2014-04-01 ENCOUNTER — Encounter: Payer: Self-pay | Admitting: Internal Medicine

## 2014-04-02 ENCOUNTER — Telehealth: Payer: Self-pay

## 2014-04-02 NOTE — Telephone Encounter (Signed)
Returned patient phone call Patient not available Left message on voice mail

## 2014-04-12 ENCOUNTER — Ambulatory Visit: Payer: Medicare Other | Attending: Internal Medicine | Admitting: Internal Medicine

## 2014-04-12 ENCOUNTER — Encounter: Payer: Self-pay | Admitting: Internal Medicine

## 2014-04-12 VITALS — BP 184/75 | HR 83 | Temp 98.1°F | Resp 17

## 2014-04-12 DIAGNOSIS — R590 Localized enlarged lymph nodes: Secondary | ICD-10-CM | POA: Insufficient documentation

## 2014-04-12 DIAGNOSIS — B009 Herpesviral infection, unspecified: Secondary | ICD-10-CM | POA: Insufficient documentation

## 2014-04-12 DIAGNOSIS — R599 Enlarged lymph nodes, unspecified: Secondary | ICD-10-CM

## 2014-04-12 DIAGNOSIS — K121 Other forms of stomatitis: Secondary | ICD-10-CM

## 2014-04-12 DIAGNOSIS — E785 Hyperlipidemia, unspecified: Secondary | ICD-10-CM | POA: Insufficient documentation

## 2014-04-12 DIAGNOSIS — I10 Essential (primary) hypertension: Secondary | ICD-10-CM | POA: Insufficient documentation

## 2014-04-12 DIAGNOSIS — K1370 Unspecified lesions of oral mucosa: Secondary | ICD-10-CM | POA: Insufficient documentation

## 2014-04-12 DIAGNOSIS — Z8619 Personal history of other infectious and parasitic diseases: Secondary | ICD-10-CM

## 2014-04-12 DIAGNOSIS — M62838 Other muscle spasm: Secondary | ICD-10-CM | POA: Insufficient documentation

## 2014-04-12 LAB — LIPID PANEL
Cholesterol: 211 mg/dL — ABNORMAL HIGH (ref 0–200)
HDL: 58 mg/dL (ref >39)
LDL CALC: 145 mg/dL — AB (ref 0–99)
TRIGLYCERIDES: 39 mg/dL (ref <150)
Total CHOL/HDL Ratio: 3.6 Ratio
VLDL: 8 mg/dL (ref 0–40)

## 2014-04-12 LAB — COMPLETE METABOLIC PANEL WITH GFR
ALT: 16 U/L (ref 0–35)
AST: 22 U/L (ref 0–37)
Albumin: 4.1 g/dL (ref 3.5–5.2)
Alkaline Phosphatase: 69 U/L (ref 39–117)
BUN: 10 mg/dL (ref 6–23)
CALCIUM: 9.3 mg/dL (ref 8.4–10.5)
CO2: 28 meq/L (ref 19–32)
CREATININE: 0.63 mg/dL (ref 0.50–1.10)
Chloride: 101 mEq/L (ref 96–112)
GFR, Est Non African American: >89 mL/min
GLUCOSE: 91 mg/dL (ref 70–99)
Potassium: 3.7 mEq/L (ref 3.5–5.3)
Sodium: 140 mEq/L (ref 135–145)
Total Bilirubin: 0.7 mg/dL (ref 0.2–1.2)
Total Protein: 6.8 g/dL (ref 6.0–8.3)

## 2014-04-12 MED ORDER — BENZOCAINE 10 % MT GEL: 1.0000 "application " | OROMUCOSAL | Status: DC | PRN

## 2014-04-12 MED ORDER — ACYCLOVIR 200 MG PO CAPS: 200.0000 mg | ORAL_CAPSULE | Freq: Every day | ORAL | Status: DC

## 2014-04-12 MED ORDER — VALSARTAN 160 MG PO TABS: 160.0000 mg | ORAL_TABLET | Freq: Every day | ORAL | Status: DC

## 2014-04-12 MED ORDER — CYCLOBENZAPRINE HCL 5 MG PO TABS: 5.0000 mg | ORAL_TABLET | Freq: Every day | ORAL | Status: DC | PRN

## 2014-04-12 NOTE — Progress Notes (Signed)
Patient complains of having three lumps to the back of her head and one  On the back of her neck Complains of having blisters inside her mouth States the blisters appeared after taking her atorvastatin Having pain to her right leg

## 2014-04-12 NOTE — Progress Notes (Signed)
MRN: [id number] Name: Regina [name]  Sex: female Age: 73 y.o. DOB: 1941-08-12  Allergies: Peanuts  Chief Complaint  Patient presents with  . Blister    HPI: Patient is 73 y.o. female who comes today for followup her history of hypertension asthma anemia hyperlipidemia, patient reported to have cervical lymphadenopathy for several months denies any increase in size but is concerned, she denies any fever chills chest and shortness of breath did reported to have some mouth ulcers recently, she has history of herpes and is already taking acyclovir, patient is requesting refill on her medications, she also takes muscle relaxant. Patient has done blood work today.  Past Medical History  Diagnosis Date  . Asthma   . High cholesterol   . Hypertension     History reviewed. No pertinent past surgical history.    Medication List       This list is accurate as of: 04/12/14  1:05 PM.  Always use your most recent med list.               acyclovir 200 MG capsule  Commonly known as:  ZOVIRAX  Take 1 capsule (200 mg total) by mouth daily.     albuterol 108 (90 BASE) MCG/ACT inhaler  Commonly known as:  PROVENTIL HFA;VENTOLIN HFA  Inhale 2 puffs into the lungs every 6 (six) hours as needed. For shortness of breath/wheezing     aspirin EC 81 MG tablet  Take 81 mg by mouth daily.     atorvastatin 40 MG tablet  Commonly known as:  LIPITOR  Take 1 tablet (40 mg total) by mouth daily.     benzocaine 10 % mucosal gel  Commonly known as:  ORAJEL  Use as directed 1 application in the mouth or throat as needed for mouth pain.     cephALEXin 500 MG capsule  Commonly known as:  KEFLEX  Take 1 capsule (500 mg total) by mouth 3 (three) times daily.     cyclobenzaprine 5 MG tablet  Commonly known as:  FLEXERIL  Take 1 tablet (5 mg total) by mouth daily as needed. For muscle spasms     dexamethasone 4 MG tablet  Commonly known as:  DECADRON  Take 4 tablets (16 mg total) by mouth  once. Take all 4 pills morning of 02/17/2013     famotidine 20 MG tablet  Commonly known as:  PEPCID  Take 20 mg by mouth daily.     traMADol 50 MG tablet  Commonly known as:  ULTRAM  Take 2 tablets (100 mg total) by mouth every 8 (eight) hours as needed for pain.     valsartan 160 MG tablet  Commonly known as:  DIOVAN  Take 1 tablet (160 mg total) by mouth daily.        Meds ordered this encounter  Medications  . benzocaine (ORAJEL) 10 % mucosal gel    Sig: Use as directed 1 application in the mouth or throat as needed for mouth pain.    Dispense:  5.3 g    Refill:  0  . acyclovir (ZOVIRAX) 200 MG capsule    Sig: Take 1 capsule (200 mg total) by mouth daily.    Dispense:  30 capsule    Refill:  0  . DISCONTD: cyclobenzaprine (FLEXERIL) 5 MG tablet    Sig: Take 1 tablet (5 mg total) by mouth daily as needed. For muscle spasms    Dispense:  30 tablet    Refill:  3  . DISCONTD: valsartan (DIOVAN) 160 MG tablet    Sig: Take 1 tablet (160 mg total) by mouth daily.    Dispense:  30 tablet    Refill:  5  . valsartan (DIOVAN) 160 MG tablet    Sig: Take 1 tablet (160 mg total) by mouth daily.    Dispense:  30 tablet    Refill:  5  . cyclobenzaprine (FLEXERIL) 5 MG tablet    Sig: Take 1 tablet (5 mg total) by mouth daily as needed. For muscle spasms    Dispense:  30 tablet    Refill:  3     There is no immunization history on file for this patient.  Family History  Problem Relation Age of Onset  . Heart disease Maternal Grandmother   . Asthma Maternal Grandfather   . Heart disease Maternal Grandfather   . Cancer Mother   . Diabetes Father     History  Substance Use Topics  . Smoking status: Never Smoker   . Smokeless tobacco: Not on file  . Alcohol Use: No    Review of Systems   As noted in HPI  Filed Vitals:   04/12/14 1240  BP: 184/75  Pulse: 83  Temp: 98.1 F (36.7 C)  Resp: 17    Physical Exam  Physical Exam  HENT:  Mouth ulcer  Eyes: EOM  are normal. Pupils are equal, round, and reactive to light.  Neck:  posterior right cervical Palpable lymph node, nontender  Cardiovascular: Normal rate and regular rhythm.   Pulmonary/Chest: No respiratory distress. She has no wheezes.  Abdominal: Bowel sounds are normal. She exhibits no distension. There is no tenderness.  Musculoskeletal: She exhibits no edema.    CBC    Component Value Date/Time   WBC 5.4 10/27/2012 1308   RBC 4.51 03/01/2014 1143   RBC 4.43 10/27/2012 1308   HGB 12.2 09/26/2013 1402   HCT 36.0 09/26/2013 1402   PLT 188 10/27/2012 1308   MCV 84.4 10/27/2012 1308   LYMPHSABS 2.7 10/27/2012 1308   MONOABS 0.3 10/27/2012 1308   EOSABS 0.2 10/27/2012 1308   BASOSABS 0.0 10/27/2012 1308    CMP     Component Value Date/Time   NA 141 09/26/2013 1402   K 3.7 09/26/2013 1402   CL 101 09/26/2013 1402   CO2 29 10/27/2012 1308   GLUCOSE 95 09/26/2013 1402   BUN 13 09/26/2013 1402   CREATININE 0.80 09/26/2013 1402   CALCIUM 10.0 10/27/2012 1308   PROT 8.0 10/27/2012 1308   ALBUMIN 4.2 10/27/2012 1308   AST 25 10/27/2012 1308   ALT 16 10/27/2012 1308   ALKPHOS 80 10/27/2012 1308   BILITOT 0.4 10/27/2012 1308   GFRNONAA 89* 10/27/2012 1308   GFRAA >90 10/27/2012 1308    No results found for this basename: chol, tri, ldl    No components found with this basename: hga1c    Lab Results  Component Value Date/Time   AST 25 10/27/2012  1:08 PM    Assessment and Plan  Essential hypertension, benign - Plan: COMPLETE METABOLIC PANEL WITH GFR, TSH, valsartan (DIOVAN) 160 MG tablet, DISCONTINUED: valsartan (DIOVAN) 160 MG tablet  Other and unspecified hyperlipidemia - Plan: Fasting Lipid panel  Cervical lymphadenopathy -for several months Plan: Ambulatory referral to General Surgery for further evaluation.  Mouth ulcer Prescribed Orajel.  History of herpes labialis - Plan: benzocaine (ORAJEL) 10 % mucosal gel, acyclovir (ZOVIRAX) 200 MG capsule  Muscle spasm - Plan:  cyclobenzaprine (  FLEXERIL) 5 MG tablet, DISCONTINUED: cyclobenzaprine (FLEXERIL) 5 MG tablet   Return in about 3 months (around 07/12/2014) for hypertension.  Lorayne Marek, MD

## 2014-04-13 LAB — TSH: TSH: 1.368 u[IU]/mL (ref 0.350–4.500)

## 2014-04-22 ENCOUNTER — Telehealth: Payer: Self-pay

## 2014-04-22 NOTE — Telephone Encounter (Signed)
Patient not available Left message on voice mail of her results She can call the office is she has any questions

## 2014-04-22 NOTE — Telephone Encounter (Signed)
Message copied by Dorothe Pea on Mon Apr 22, 2014  3:48 PM ------      Message from: Lorayne Marek      Created: Mon Apr 22, 2014 12:03 PM       Blood work reviewed, noticed elevated cholesterol, advise patient for low fat diet.       ------

## 2014-04-29 ENCOUNTER — Encounter (INDEPENDENT_AMBULATORY_CARE_PROVIDER_SITE_OTHER): Payer: Self-pay | Admitting: Surgery

## 2014-04-29 ENCOUNTER — Ambulatory Visit (INDEPENDENT_AMBULATORY_CARE_PROVIDER_SITE_OTHER): Payer: Medicare Other | Admitting: Surgery

## 2014-04-29 VITALS — BP 140/70 | HR 72 | Temp 97.4°F | Ht 62.0 in | Wt 150.0 lb

## 2014-04-29 DIAGNOSIS — R220 Localized swelling, mass and lump, head: Secondary | ICD-10-CM

## 2014-04-29 DIAGNOSIS — R221 Localized swelling, mass and lump, neck: Secondary | ICD-10-CM

## 2014-04-29 NOTE — Patient Instructions (Signed)
Call to set set up surgery to remove growth on posterior part of neck when ready.

## 2014-04-29 NOTE — Progress Notes (Signed)
Patient ID: Regina [name], female   DOB: 1941/05/04, 73 y.o.   MRN: [id number]  Chief Complaint  Patient presents with  . nodule right posterior neck    HPI Regina [name] is a 73 y.o. female.  Pt sent at the request of Dr Annitta Needs for mass on posterior neck.  Pt poor historian and not sure how long it has been there.  Some tenderness.  No drainage.  History of cancer many years ago in [place] but cannot remember what it was.   HPI  Past Medical History  Diagnosis Date  . Asthma   . High cholesterol   . Hypertension     History reviewed. No pertinent past surgical history.  Family History  Problem Relation Age of Onset  . Heart disease Maternal Grandmother   . Asthma Maternal Grandfather   . Heart disease Maternal Grandfather   . Cancer Mother   . Diabetes Father     Social History History  Substance Use Topics  . Smoking status: Never Smoker   . Smokeless tobacco: Not on file  . Alcohol Use: No    Allergies  Allergen Reactions  . Peanuts [Peanut Oil]     Current Outpatient Prescriptions  Medication Sig Dispense Refill  . acyclovir (ZOVIRAX) 200 MG capsule Take 1 capsule (200 mg total) by mouth daily.  30 capsule  0  . albuterol (PROVENTIL HFA;VENTOLIN HFA) 108 (90 BASE) MCG/ACT inhaler Inhale 2 puffs into the lungs every 6 (six) hours as needed. For shortness of breath/wheezing      . aspirin EC 81 MG tablet Take 81 mg by mouth daily.      Marland Kitchen atorvastatin (LIPITOR) 40 MG tablet Take 1 tablet (40 mg total) by mouth daily.  30 tablet  3  . benzocaine (ORAJEL) 10 % mucosal gel Use as directed 1 application in the mouth or throat as needed for mouth pain.  5.3 g  0  . cyclobenzaprine (FLEXERIL) 5 MG tablet Take 1 tablet (5 mg total) by mouth daily as needed. For muscle spasms  30 tablet  3  . famotidine (PEPCID) 20 MG tablet Take 20 mg by mouth daily.       . valsartan (DIOVAN) 160 MG tablet Take 1 tablet (160 mg total) by mouth daily.  30 tablet  5   No current  facility-administered medications for this visit.    Review of Systems Review of Systems  Constitutional: Negative.  Negative for fever, chills and unexpected weight change.  HENT: Negative.  Negative for congestion, hearing loss, sore throat, trouble swallowing and voice change.   Eyes: Negative.  Negative for visual disturbance.  Respiratory: Negative.  Negative for cough and wheezing.   Cardiovascular: Negative for chest pain, palpitations and leg swelling.  Gastrointestinal: Negative for nausea, vomiting, abdominal pain, diarrhea, constipation, blood in stool, abdominal distention and anal bleeding.  Genitourinary: Negative for hematuria, vaginal bleeding and difficulty urinating.  Musculoskeletal: Negative for arthralgias.  Skin: Negative for rash and wound.  Neurological: Negative for seizures, syncope and headaches.  Hematological: Negative for adenopathy. Does not bruise/bleed easily.  Psychiatric/Behavioral: Negative for confusion.    Blood pressure 140/70, pulse 72, temperature 97.4 F (36.3 C), height 5\' 2"  (1.575 m), weight 150 lb (68.04 kg).  Physical Exam Physical Exam  Constitutional: She is oriented to person, place, and time. She has a sickly appearance.  HENT:  Head: Hair is abnormal.  Eyes: Pupils are equal, round, and reactive to light. No scleral icterus.  Neck: No tracheal deviation present. No thyromegaly present.    Pulmonary/Chest: Effort normal.  Musculoskeletal: Normal range of motion.  Lymphadenopathy:    She has no cervical adenopathy.  Neurological: She is alert and oriented to person, place, and time.  Skin: Skin is warm and dry.  Psychiatric: Her mood appears anxious. Her speech is rapid and/or pressured and tangential. She is hyperactive. She expresses impulsivity.    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  She will contact me when  ready.  She has some ill family members and wishes to visit them . The procedure has been discussed with the patient.  Alternative therapies have been discussed with the patient.  Operative risks include bleeding,  Infection,  Organ injury,  Nerve injury,  Blood vessel injury,  DVT,  Pulmonary embolism,  Death,  And possible reoperation.  Medical management risks include worsening of present situation.  The success of the procedure is 50 -90 % at treating patients symptoms.  The patient understands and agrees to proceed.       Cornell Bourbon A. Lamis Behrmann 04/29/2014, 3:40 PM

## 2014-05-25 ENCOUNTER — Emergency Department (HOSPITAL_COMMUNITY)
Admission: EM | Admit: 2014-05-25 | Discharge: 2014-05-25 | Disposition: A | Discharge status: Home / Self Care | Payer: Medicare Other | Attending: Emergency Medicine | Admitting: Emergency Medicine

## 2014-05-25 ENCOUNTER — Encounter (HOSPITAL_COMMUNITY): Payer: Self-pay | Admitting: Emergency Medicine

## 2014-05-25 DIAGNOSIS — Z7982 Long term (current) use of aspirin: Secondary | ICD-10-CM | POA: Insufficient documentation

## 2014-05-25 DIAGNOSIS — J45909 Unspecified asthma, uncomplicated: Secondary | ICD-10-CM | POA: Insufficient documentation

## 2014-05-25 DIAGNOSIS — E7800 Pure hypercholesterolemia, unspecified: Secondary | ICD-10-CM | POA: Insufficient documentation

## 2014-05-25 DIAGNOSIS — R209 Unspecified disturbances of skin sensation: Secondary | ICD-10-CM | POA: Insufficient documentation

## 2014-05-25 DIAGNOSIS — G5790 Unspecified mononeuropathy of unspecified lower limb: Secondary | ICD-10-CM | POA: Insufficient documentation

## 2014-05-25 DIAGNOSIS — Z79899 Other long term (current) drug therapy: Secondary | ICD-10-CM | POA: Insufficient documentation

## 2014-05-25 DIAGNOSIS — G5793 Unspecified mononeuropathy of bilateral lower limbs: Secondary | ICD-10-CM

## 2014-05-25 DIAGNOSIS — I10 Essential (primary) hypertension: Secondary | ICD-10-CM | POA: Insufficient documentation

## 2014-05-25 DIAGNOSIS — R350 Frequency of micturition: Secondary | ICD-10-CM | POA: Insufficient documentation

## 2014-05-25 LAB — I-STAT CHEM 8, ED
BUN: 8 mg/dL (ref 6–23)
CHLORIDE: 99 meq/L (ref 96–112)
Calcium, Ion: 1.27 mmol/L (ref 1.13–1.30)
Creatinine, Ser: 0.9 mg/dL (ref 0.50–1.10)
Glucose, Bld: 80 mg/dL (ref 70–99)
HCT: 38 % (ref 36.0–46.0)
Hemoglobin: 12.9 g/dL (ref 12.0–15.0)
POTASSIUM: 3.9 meq/L (ref 3.7–5.3)
SODIUM: 144 meq/L (ref 137–147)
TCO2: 29 mmol/L (ref 0–100)

## 2014-05-25 LAB — URINALYSIS, ROUTINE W REFLEX MICROSCOPIC
Bilirubin Urine: NEGATIVE
Glucose, UA: NEGATIVE mg/dL
Ketones, ur: NEGATIVE mg/dL
Leukocytes, UA: NEGATIVE
Nitrite: NEGATIVE
Protein, ur: NEGATIVE mg/dL
SPECIFIC GRAVITY, URINE: 1.011 (ref 1.005–1.030)
UROBILINOGEN UA: 0.2 mg/dL (ref 0.0–1.0)
pH: 8 (ref 5.0–8.0)

## 2014-05-25 LAB — CBG MONITORING, ED: GLUCOSE-CAPILLARY: 99 mg/dL (ref 70–99)

## 2014-05-25 LAB — URINE MICROSCOPIC-ADD ON

## 2014-05-25 NOTE — ED Provider Notes (Signed)
Medical screening examination/treatment/procedure(s) were conducted as a shared visit with non-physician practitioner(s) and myself.  I personally evaluated the patient during the encounter.   EKG Interpretation None       Leota Jacobsen, MD 05/25/14 2320

## 2014-05-25 NOTE — ED Notes (Signed)
Per pt sts she has been having a lot of urinary frequency over the past few days. sts also burning and numbness in both feet, arms, sts "pretty much all over". sts sharp pains like pins and needles.

## 2014-05-25 NOTE — ED Provider Notes (Signed)
Medical screening examination/treatment/procedure(s) were conducted as a shared visit with non-physician practitioner(s) and myself.  I personally evaluated the patient during the encounter.   EKG Interpretation None     Patient here complaining of bilateral lower extremity paresthesias x2-3 months. No focal deficits. No back pain noted. Symptoms are from her knees all the way down to her feet. Hasn't seen by her Dr. for this without a diagnosis. She has no focal deficits on exam and has been referred back to her primary care Dr.  Leota Jacobsen, MD 05/25/14 [phone]

## 2014-05-25 NOTE — ED Provider Notes (Signed)
CSN: [id number]     Arrival date & time 05/25/14  1353 History   First MD Initiated Contact with Patient 05/25/14 1551     Chief Complaint  Patient presents with  . Urinary Frequency  . Numbness    (Consider location/radiation/quality/duration/timing/severity/associated sxs/prior Treatment) HPI Comments: Patient is a 73 year old female with history of high cholesterol and hypertension who presents to the emergency department for 2 complaints.  #1 - Patient complaining of urinary frequency x2 weeks. She endorses a history of overactive bladder and states she used to be on medication for this, but stopped taking this medication in 2003. Patient states that she has been feeling the urge to urinate at least twice an hour. She denies any modifying factors of the symptoms and has not taken any medication to attempt to alleviate her urgency or followed up with her primary care provider to discuss symptoms. Patient denies associated fever, abdominal pain, dysuria, N/V/D, stool incontinence, back pain, and hematuria.  #2 - Patient also c/o paresthesias in her b/l feet x 2 months. Patient states she feels a "pins and needles" sensation in her b/l great toes and along the medial portion of her feet. She states symptoms are intermittent and without alleviating factors. Paresthesias are relieved spontaneously, but worsen with dorsiflexion of her ankles. She denies associated trauma or injury, foot/ankle swelling, calf pain, claudication, back pain, and color change/erythema/pallor. No hx of diabetes.  Patient is a 73 y.o. female presenting with frequency. The history is provided by the patient. No language interpreter was used.  Urinary Frequency Pertinent negatives include no chest pain, fever or weakness.    Past Medical History  Diagnosis Date  . Asthma   . High cholesterol   . Hypertension    History reviewed. No pertinent past surgical history. Family History  Problem Relation Age of Onset  .  Heart disease Maternal Grandmother   . Asthma Maternal Grandfather   . Heart disease Maternal Grandfather   . Cancer Mother   . Diabetes Father    History  Substance Use Topics  . Smoking status: Never Smoker   . Smokeless tobacco: Not on file  . Alcohol Use: No   OB History   Grav Para Term Preterm Abortions TAB SAB Ect Mult Living                  Review of Systems  Constitutional: Negative for fever.  Respiratory: Negative for shortness of breath.   Cardiovascular: Negative for chest pain.  Gastrointestinal: Negative for blood in stool.  Genitourinary: Positive for urgency and frequency. Negative for dysuria, hematuria and pelvic pain.  Musculoskeletal: Negative for back pain.  Neurological: Negative for syncope and weakness.       +parasthesias  All other systems reviewed and are negative.    Allergies  Peanuts  Home Medications   Prior to Admission medications   Medication Sig Start Date End Date Taking? Authorizing Provider  acyclovir (ZOVIRAX) 200 MG capsule Take 1 capsule (200 mg total) by mouth daily. 04/12/14   Lorayne Marek, MD  albuterol (PROVENTIL HFA;VENTOLIN HFA) 108 (90 BASE) MCG/ACT inhaler Inhale 2 puffs into the lungs every 6 (six) hours as needed. For shortness of breath/wheezing    Historical Provider, MD  aspirin EC 81 MG tablet Take 81 mg by mouth daily.    Historical Provider, MD  atorvastatin (LIPITOR) 40 MG tablet Take 1 tablet (40 mg total) by mouth daily. 03/01/14   Lorayne Marek, MD  benzocaine (ORAJEL) 10 % mucosal  gel Use as directed 1 application in the mouth or throat as needed for mouth pain. 04/12/14   Lorayne Marek, MD  cyclobenzaprine (FLEXERIL) 5 MG tablet Take 1 tablet (5 mg total) by mouth daily as needed. For muscle spasms 04/12/14   Lorayne Marek, MD  famotidine (PEPCID) 20 MG tablet Take 20 mg by mouth daily.     Historical Provider, MD  valsartan (DIOVAN) 160 MG tablet Take 1 tablet (160 mg total) by mouth daily. 04/12/14   Lorayne Marek, MD   BP 129/75  Pulse 63  Temp(Src) 98.7 F (37.1 C) (Oral)  Resp 18  Wt 152 lb (68.947 kg)  SpO2 100%  Physical Exam  Nursing note and vitals reviewed. Constitutional: She is oriented to person, place, and time. She appears well-developed and well-nourished. No distress.  Nontoxic/nonseptic appearing  HENT:  Head: Normocephalic and atraumatic.  Mouth/Throat: Oropharynx is clear and moist. No oropharyngeal exudate.  Eyes: Conjunctivae and EOM are normal. Pupils are equal, round, and reactive to light. No scleral icterus.  Normal EOMs. No nystagmus.  Neck: Normal range of motion.  Cardiovascular: Normal rate, regular rhythm and normal heart sounds.   Pulses:      Dorsalis pedis pulses are 2+ on the right side, and 2+ on the left side.       Posterior tibial pulses are 2+ on the right side, and 2+ on the left side.  Pulmonary/Chest: Effort normal and breath sounds normal. No respiratory distress. She has no wheezes. She has no rales.  Abdominal: Soft. She exhibits no distension. There is no tenderness. There is no rebound and no guarding.  Soft without tenderness. No masses.  Musculoskeletal: Normal range of motion. She exhibits no edema.  Patient moving all extremities. No lower extremity edema. No calf tenderness.  Neurological: She is alert and oriented to person, place, and time.  GCS 15. Patient speaks in full goal oriented sentences. Neurologic exam nonfocal. Sensation to light touch intact in bilateral lower extremities. Patellar and Achilles reflexes 2+ bilaterally. Patient wiggles all toes. She moves her extremities without ataxia.  Skin: Skin is warm and dry. No rash noted. She is not diaphoretic. No erythema. No pallor.  Psychiatric: She has a normal mood and affect. Her behavior is normal.    ED Course  Procedures (including critical care time) Labs Review Labs Reviewed  URINALYSIS, ROUTINE W REFLEX MICROSCOPIC - Abnormal; Notable for the following:    Hgb  urine dipstick SMALL (*)    All other components within normal limits  URINE CULTURE  URINE MICROSCOPIC-ADD ON  CBG MONITORING, ED  I-STAT CHEM 8, ED    Imaging Review No results found.   EKG Interpretation None      MDM   Final diagnoses:  Neuropathy involving both lower extremities  Urinary frequency    Patient presenting with c/o paresthesias and urinary frequency. Complaints unrelated and chronic in nature. Patient nontoxic/nonseptic appearing, hemodynamically stable, and afebrile. Neurologic exam nonfocal today. No sensory deficits appreciated on exam. No back pain. No hx of trauma or injury to back or feet.  Urinary frequency and urgency likely secondary to overactive bladder, which patient endorses a hx of. No red flags or signs concerning for cauda equina. No stool incontinence. No evidence of UTI today. Kidney function preserved.  Patient stable for d/c with instruction to f/u with her PCP to discuss symptoms. Do not believe further emergent work up is indicated. Return precautions provided. Patient agreeable to plan with no unaddressed concerns.  Filed Vitals:   05/25/14 1356 05/25/14 1619 05/25/14 1630 05/25/14 1730  BP: 136/69 123/66 117/70 129/75  Pulse: 90 72 63 63  Temp: 98.7 F (37.1 C)     TempSrc: Oral     Resp: 18     Weight: 152 lb (68.947 kg)     SpO2: 97% 100% 98% 100%      Antonietta Breach, PA-C 05/25/14 1840

## 2014-05-25 NOTE — Discharge Instructions (Signed)
Neuropathic Pain We often think that pain has a physical cause. If we get rid of the cause, the pain should go away. Nerves themselves can also cause pain. It is called neuropathic pain, which means nerve abnormality. It may be difficult for the patients who have it and for the treating caregivers. Pain is usually described as acute (short-lived) or chronic (long-lasting). Acute pain is related to the physical sensations caused by an injury. It can last from a few seconds to many weeks, but it usually goes away when normal healing occurs. Chronic pain lasts beyond the typical healing time. With neuropathic pain, the nerve fibers themselves may be damaged or injured. They then send incorrect signals to other pain centers. The pain you feel is real, but the cause is not easy to find.  CAUSES  Chronic pain can result from diseases, such as diabetes and shingles (an infection related to chickenpox), or from trauma, surgery, or amputation. It can also happen without any known injury or disease. The nerves are sending pain messages, even though there is no identifiable cause for such messages.   Other common causes of neuropathy include diabetes, phantom limb pain, or Regional Pain Syndrome (RPS).  As with all forms of chronic back pain, if neuropathy is not correctly treated, there can be a number of associated problems that lead to a downward cycle for the patient. These include depression, sleeplessness, feelings of fear and anxiety, limited social interaction and inability to do normal daily activities or work.  The most dramatic and mysterious example of neuropathic pain is called "phantom limb syndrome." This occurs when an arm or a leg has been removed because of illness or injury. The brain still gets pain messages from the nerves that originally carried impulses from the missing limb. These nerves now seem to misfire and cause troubling pain.  Neuropathic pain often seems to have no cause. It responds  poorly to standard pain treatment. Neuropathic pain can occur after:  Shingles (herpes zoster virus infection).  A lasting burning sensation of the skin, caused usually by injury to a peripheral nerve.  Peripheral neuropathy which is widespread nerve damage, often caused by diabetes or alcoholism.  Phantom limb pain following an amputation.  Facial nerve problems (trigeminal neuralgia).  Multiple sclerosis.  Reflex sympathetic dystrophy.  Pain which comes with cancer and cancer chemotherapy.  Entrapment neuropathy such as when pressure is put on a nerve such as in carpal tunnel syndrome.  Back, leg, and hip problems (sciatica).  Spine or back surgery.  HIV Infection or AIDS where nerves are infected by viruses. Your caregiver can explain items in the above list which may apply to you. SYMPTOMS  Characteristics of neuropathic pain are:  Severe, sharp, electric shock-like, shooting, lightening-like, knife-like.  Pins and needles sensation.  Deep burning, deep cold, or deep ache.  Persistent numbness, tingling, or weakness.  Pain resulting from light touch or other stimulus that would not usually cause pain.  Increased sensitivity to something that would normally cause pain, such as a pinprick. Pain may persist for months or years following the healing of damaged tissues. When this happens, pain signals no longer sound an alarm about current injuries or injuries about to happen. Instead, the alarm system itself is not working correctly.  Neuropathic pain may get worse instead of better over time. For some people, it can lead to serious disability. It is important to be aware that severe injury in a limb can occur without a proper, protective pain  response.Burns, cuts, and other injuries may go unnoticed. Without proper treatment, these injuries can become infected or lead to further disability. Take any injury seriously, and consult your caregiver for treatment. DIAGNOSIS    When you have a pain with no known cause, your caregiver will probably ask some specific questions:   Do you have any other conditions, such as diabetes, shingles, multiple sclerosis, or HIV infection?  How would you describe your pain? (Neuropathic pain is often described as shooting, stabbing, burning, or searing.)  Is your pain worse at any time of the day? (Neuropathic pain is usually worse at night.)  Does the pain seem to follow a certain physical pathway?  Does the pain come from an area that has missing or injured nerves? (An example would be phantom limb pain.)  Is the pain triggered by minor things such as rubbing against the sheets at night? These questions often help define the type of pain involved. Once your caregiver knows what is happening, treatment can begin. Anticonvulsant, antidepressant drugs, and various pain relievers seem to work in some cases. If another condition, such as diabetes is involved, better management of that disorder may relieve the neuropathic pain.  TREATMENT  Neuropathic pain is frequently long-lasting and tends not to respond to treatment with narcotic type pain medication. It may respond well to other drugs such as antiseizure and antidepressant medications. Usually, neuropathic problems do not completely go away, but partial improvement is often possible with proper treatment. Your caregivers have large numbers of medications available to treat you. Do not be discouraged if you do not get immediate relief. Sometimes different medications or a combination of medications will be tried before you receive the results you are hoping for. See your caregiver if you have pain that seems to be coming from nowhere and does not go away. Help is available.  SEEK IMMEDIATE MEDICAL CARE IF:   There is a sudden change in the quality of your pain, especially if the change is on only one side of the body.  You notice changes of the skin, such as redness, black or  purple discoloration, swelling, or an ulcer.  You cannot move the affected limbs. Document Released: 09/02/2004 Document Revised: 02/28/2012 Document Reviewed: 09/02/2004 [facility] Patient Information 2014 [place]. Overactive Bladder, Adult The bladder has two functions that are totally opposite of the other. One is to relax and stretch out so it can store urine (fills like a balloon), and the other is to contract and squeeze down so that it can empty the urine that it has stored. Proper functioning of the bladder is a complex mixing of these two functions. The filling and emptying of the bladder can be influenced by:  The bladder.  The spinal cord.  The brain.  The nerves going to the bladder.  Other organs that are closely related to the bladder such as prostate in males and the vagina in females. As your bladder fills with urine, nerve signals are sent from the bladder to the brain to tell you that you may need to urinate. Normal urination requires that the bladder squeeze down with sufficient strength to empty the bladder, but this also requires that the bladder squeeze down sufficiently long to finish the job. In addition the sphincter muscles, which normally keep you from leaking urine, must also relax so that the urine can pass. Coordination between the bladder muscle squeezing down and the sphincter muscles relaxing is required to make everything happen normally. With an overactive  bladder sometimes the muscles of the bladder contract unexpectedly and involuntarily and this causes an urgent need to urinate. The normal response is to try to hold urine in by contracting the sphincter muscles. Sometimes the bladder contracts so strongly that the sphincter muscles cannot stop the urine from passing out and incontinence occurs. This kind of incontinence is called urge incontinence. Having an overactive bladder can be embarrassing and awkward. It can keep you from living life the way you  want to. Many people think it is just something you have to put up with as you grow older or have certain health conditions. In fact, there are treatments that can help make your life easier and more pleasant. CAUSES  Many things can cause an overactive bladder. Possibilities include:  Urinary tract infection or infection of nearby tissues such as the prostate.  Prostate enlargement.  In women, multiple pregnancies or surgery on the uterus or urethra.  Bladder stones, inflammation or tumors.  Caffeine.  Alcohol.  Medications. For example, diuretics (drugs that help the body get rid of extra fluid) increase urine production. Some other medicines must be taken with lots of fluids.  Muscle or nerve weakness. This might be the result of a spinal cord injury, a stroke, multiple sclerosis or Parkinson's disease.  Diabetes can cause a high urine volume which fills the bladder so quickly that the normal urge to urinate is triggered very strongly. SYMPTOMS   Loss of bladder control. You feel the need to urinate and cannot make your body wait.  Sudden, strong urges to urinate.  Urinating 8 or more times a day.  Waking up to urinate two or more times a night. DIAGNOSIS  To decide if you have overactive bladder, your healthcare provider will probably:  Ask about symptoms you have noticed.  Ask about your overall health. This will include questions about any medications you are taking.  Do a physical examination. This will help determine if there are obvious blockages or other problems.  Order some tests. These might include:  A blood test to check for diabetes or other health issues that could be contributing to the problem.  Urine testing. This could measure the flow of urine and the pressure on the bladder.  A test of your neurological system (the brain, spinal cord and nerves). This is the system that senses the need to urinate. Some of these tests are called flow tests, bladder  pressure tests and electrical measurements of the sphincter muscle.  A bladder test to check whether it is emptying completely when you urinate.  Cytoscopy. This test uses a thin tube with a tiny camera on it. It offers a look inside your urethra and bladder to see if there are problems.  Imaging tests. You might be given a contrast dye and then asked to urinate. X-rays are taken to see how your bladder is working. TREATMENT  An overactive bladder can be treated in many ways. The treatment will depend on the cause. Whether you have a mild or severe case also makes a difference. Often, treatment can be given in your healthcare provider's office or clinic. Be sure to discuss the different options with your caregiver. They include:  Behavioral treatments. These do not involve medication or surgery:  Bladder training. For this, you would follow a schedule to urinate at regular intervals. This helps you learn to control the urge to urinate. At first, you might be asked to wait a few minutes after feeling the urge. In time,  you should be able to schedule bathroom visits an hour or more apart.  Kegel exercises. These exercises strengthen the pelvic floor muscles, which support the bladder. By toning these muscles, they can help control urination, even if the bladder muscles are overactive. A specialist will teach you how to do these exercises correctly. They will require daily practice.  Weight loss. If you are obese or overweight, losing weight might stop your bladder from being overactive. Talk to your healthcare provider about how many pounds you should lose. Also ask if there is a specific program or method that would work best for you.  Diet change. This might be suggested if constipation is making your overactive bladder worse. Your healthcare provider or a nutritionist can explain ways to change what you eat to ease constipation. Other people might need to take in less caffeine or alcohol.  Sometimes drinking fewer fluids is needed, too.  Protection. This is not an actual treatment. But, you could wear special pads to take care of any leakage while you wait for other treatments to take effect. This will help you avoid embarrassment.  Physical treatments.  Electrical stimulation. Electrodes will send gentle pulses to the nerves or muscles that help control the bladder. The goal is to strengthen them. Sometimes this is done with the electrodes outside of the body. Or, they might be placed inside the body (implanted). This treatment can take several months to have an effect.  Medications. These are usually used along with other treatments. Several medicines are available. Some are injected into the muscles involved in urination. Others come in pill form. Medications sometimes prescribed include:  Anticholinergics. These drugs block the signals that the nerves deliver to the bladder. This keeps it from releasing urine at the wrong time. Researchers think the drugs might help in other ways, too.  Imipramine. This is an antidepressant. But, it relaxes bladder muscles.  Botox. This is still experimental. Some people believe that injecting it into the bladder muscles will relax them so they work more normally. It has also been injected into the sphincter muscle when the sphincter muscle does not open properly. This is a temporary fix, however. Also, it might make matters worse, especially in older people.  Surgery.  A device might be implanted to help manage your nerves. It works on the nerves that signal when you need to urinate.  Surgery is sometimes needed with electrical stimulation. If the electrodes are implanted, this is done through surgery.  Sometimes repairs need to be made through surgery. For example, the size of the bladder can be changed. This is usually done in severe cases only. HOME CARE INSTRUCTIONS   Take any medications your healthcare provider prescribed or  suggested. Follow the directions carefully.  Practice any lifestyle changes that are recommended. These might include:  Drinking less fluid or drinking at different times of the day. If you need to urinate often during the night, for example, you may need to stop drinking fluids early in the evening.  Cutting down on caffeine or alcohol. They can both make an overactive bladder worse. Caffeine is found in coffee, tea and sodas.  Doing Kegel exercises to strengthen muscles.  Losing weight, if that is recommended.  Eating a healthy and balanced diet. This will help you avoid constipation.  Keep a journal or a log. You might be asked to record how much you drink and when, and also when you feel the need to urinate.  Learn how to care for  implants or other devices, such as pessaries. SEEK MEDICAL CARE IF:   Your overactive bladder gets worse.  You feel increased pain or irritation when you urinate.  You notice blood in your urine.  You have questions about any medications or devices that your healthcare provider recommended.  You notice blood, pus or swelling at the site of any test or treatment procedure.  You have an oral temperature above 102 F (38.9 C). SEEK IMMEDIATE MEDICAL CARE IF:  You have an oral temperature above 102 F (38.9 C), not controlled by medicine. Document Released: 10/02/2009 Document Revised: 02/28/2012 Document Reviewed: 10/02/2009 [facility] Patient Information 2014 [place].

## 2014-05-27 ENCOUNTER — Telehealth: Payer: Self-pay | Admitting: Internal Medicine

## 2014-05-27 ENCOUNTER — Telehealth: Payer: Self-pay | Admitting: *Deleted

## 2014-05-27 DIAGNOSIS — Z8619 Personal history of other infectious and parasitic diseases: Secondary | ICD-10-CM

## 2014-05-27 LAB — URINE CULTURE
Colony Count: NO GROWTH
Culture: NO GROWTH

## 2014-05-27 NOTE — Telephone Encounter (Signed)
Patient having numbness and burning in feet. Patient also c/o overactive bladder. Needs to be seen soon. Your next appointment is not until 06/20/2014. Please advise how to follow up with patient. Patient wants to know if she can have a GYN referral and Podiatry referral.

## 2014-05-27 NOTE — Telephone Encounter (Signed)
Pt calling because does not feel well, please f/u with pt.

## 2014-06-06 ENCOUNTER — Telehealth: Payer: Self-pay

## 2014-06-06 ENCOUNTER — Telehealth: Payer: Self-pay | Admitting: Internal Medicine

## 2014-06-06 DIAGNOSIS — M62838 Other muscle spasm: Secondary | ICD-10-CM

## 2014-06-06 DIAGNOSIS — R590 Localized enlarged lymph nodes: Secondary | ICD-10-CM

## 2014-06-06 MED ORDER — CYCLOBENZAPRINE HCL 5 MG PO TABS
5.0000 mg | ORAL_TABLET | Freq: Every day | ORAL | Status: AC | PRN
Start: 2014-06-06 — End: ?

## 2014-06-06 NOTE — Telephone Encounter (Signed)
Returned patient phone call Patient not available Left message on voice mail to return our call 

## 2014-06-06 NOTE — Telephone Encounter (Addendum)
Pt calling says she called and spoke to nurse regarding refill for cyclobenzaprine (FLEXERIL) 5 MG tablet but pharmacy [facility] on [address]) has not received yet. Please f/u with pharm and pt.    Also pt says she would like to referred to GYN Dr.

## 2014-06-13 ENCOUNTER — Encounter: Payer: Self-pay | Admitting: Obstetrics & Gynecology

## 2014-06-19 ENCOUNTER — Other Ambulatory Visit: Payer: Self-pay | Admitting: *Deleted

## 2014-06-19 DIAGNOSIS — Z8619 Personal history of other infectious and parasitic diseases: Secondary | ICD-10-CM

## 2014-06-19 MED ORDER — ACYCLOVIR 200 MG PO CAPS: 200.0000 mg | ORAL_CAPSULE | Freq: Every day | ORAL | Status: DC

## 2014-07-15 ENCOUNTER — Ambulatory Visit: Payer: Medicare Other | Admitting: Internal Medicine

## 2014-07-26 ENCOUNTER — Ambulatory Visit (INDEPENDENT_AMBULATORY_CARE_PROVIDER_SITE_OTHER): Payer: Medicare Other | Admitting: Obstetrics & Gynecology

## 2014-07-26 ENCOUNTER — Encounter: Payer: Medicare Other | Admitting: Obstetrics & Gynecology

## 2014-07-26 ENCOUNTER — Encounter: Payer: Self-pay | Admitting: Obstetrics & Gynecology

## 2014-07-26 ENCOUNTER — Other Ambulatory Visit (HOSPITAL_COMMUNITY)
Admission: RE | Admit: 2014-07-26 | Discharge: 2014-07-26 | Disposition: A | Discharge status: Home / Self Care | Payer: Medicare Other | Source: Ambulatory Visit | Attending: Obstetrics & Gynecology | Admitting: Obstetrics & Gynecology

## 2014-07-26 VITALS — BP 107/61 | HR 75 | Temp 97.7°F | Wt 149.0 lb

## 2014-07-26 DIAGNOSIS — N393 Stress incontinence (female) (male): Secondary | ICD-10-CM

## 2014-07-26 DIAGNOSIS — Z1272 Encounter for screening for malignant neoplasm of vagina: Secondary | ICD-10-CM

## 2014-07-26 DIAGNOSIS — Z1151 Encounter for screening for human papillomavirus (HPV): Secondary | ICD-10-CM

## 2014-07-26 DIAGNOSIS — Z01419 Encounter for gynecological examination (general) (routine) without abnormal findings: Secondary | ICD-10-CM

## 2014-07-26 DIAGNOSIS — Z124 Encounter for screening for malignant neoplasm of cervix: Secondary | ICD-10-CM | POA: Insufficient documentation

## 2014-07-26 DIAGNOSIS — R32 Unspecified urinary incontinence: Secondary | ICD-10-CM

## 2014-07-26 LAB — POCT URINALYSIS DIP (DEVICE)
BILIRUBIN URINE: NEGATIVE
GLUCOSE, UA: NEGATIVE mg/dL
Ketones, ur: NEGATIVE mg/dL
NITRITE: NEGATIVE
PH: 7 (ref 5.0–8.0)
Protein, ur: NEGATIVE mg/dL
SPECIFIC GRAVITY, URINE: 1.01 (ref 1.005–1.030)
Urobilinogen, UA: 0.2 mg/dL (ref 0.0–1.0)

## 2014-07-26 NOTE — Patient Instructions (Signed)
Urinary Incontinence Urinary incontinence is the involuntary loss of urine from your bladder. CAUSES  There are many causes of urinary incontinence. They include:  Medicines.  Infections.  Prostatic enlargement, leading to overflow of urine from your bladder.  Surgery.  Neurological diseases.  Emotional factors. SIGNS AND SYMPTOMS Urinary Incontinence can be divided into four types: 1. Urge incontinence. Urge incontinence is the involuntary loss of urine before you have the opportunity to go to the bathroom. There is a sudden urge to void but not enough time to reach a bathroom. 2. Stress incontinence. Stress incontinence is the sudden loss of urine with any activity that forces urine to pass. It is commonly caused by anatomical changes to the pelvis and sphincter areas of your body. 3. Overflow incontinence. Overflow incontinence is the loss of urine from an obstructed opening to your bladder. This results in a backup of urine and a resultant buildup of pressure within the bladder. When the pressure within the bladder exceeds the closing pressure of the sphincter, the urine overflows, which causes incontinence, similar to water overflowing a dam. 4. Total incontinence. Total incontinence is the loss of urine as a result of the inability to store urine within your bladder. DIAGNOSIS  Evaluating the cause of incontinence may require:  A thorough and complete medical and obstetric history.  A complete physical exam.  Laboratory tests such as a urine culture and sensitivities. When additional tests are indicated, they can include:  An ultrasound exam.  Kidney and bladder X-rays.  Cystoscopy. This is an exam of the bladder using a narrow scope.  Urodynamic testing to test the nerve function to the bladder and sphincter areas. TREATMENT  Treatment for urinary incontinence depends on the cause:  For urge incontinence caused by a bacterial infection, antibiotics will be prescribed.  If the urge incontinence is related to medicines you take, your health care provider may have you change the medicine.  For stress incontinence, surgery to re-establish anatomical support to the bladder or sphincter, or both, will often correct the condition.  For overflow incontinence caused by an enlarged prostate, an operation to open the channel through the enlarged prostate will allow the flow of urine out of the bladder. In women with fibroids, a hysterectomy may be recommended.  For total incontinence, surgery on your urinary sphincter may help. An artificial urinary sphincter (an inflatable cuff placed around the urethra) may be required. In women who have developed a hole-like passage between their bladder and vagina (vesicovaginal fistula), surgery to close the fistula often is required. HOME CARE INSTRUCTIONS  Normal daily hygiene and the use of pads or adult diapers that are changed regularly will help prevent odors and skin damage.  Avoid caffeine. It can overstimulate your bladder.  Use the bathroom regularly. Try about every 2-3 hours to go to the bathroom, even if you do not feel the need to do so. Take time to empty your bladder completely. After urinating, wait a minute. Then try to urinate again.  For causes involving nerve dysfunction, keep a log of the medicines you take and a journal of the times you go to the bathroom. SEEK MEDICAL CARE IF:  You experience worsening of pain instead of improvement in pain after your procedure.  Your incontinence becomes worse instead of better. SEE IMMEDIATE MEDICAL CARE IF:  You experience fever or shaking chills.  You are unable to pass your urine.  You have redness spreading into your groin or down into your thighs. MAKE SURE   YOU:   Understand these instructions.   Will watch your condition.  Will get help right away if you are not doing well or get worse. Document Released: 01/13/2005 Document Revised: 09/26/2013 Document  Reviewed: 05/15/2013 [employer] Patient Information 2015 [employer]. This information is not intended to replace advice given to you by your health care provider. Make sure you discuss any questions you have with your health care provider.  

## 2014-07-26 NOTE — Progress Notes (Signed)
Patient ID: Regina [name], female   DOB: 07-21-1941, 73 y.o.   MRN: [id number] Subjective:     Regina [name] is a 73 y.o. female here for a routine exam.  Current complaints: needs PAP. Pt reports freq urination since she has been having 'neck issues.'  She is to be scheduled for cervical spine surgery. Pt reports leakage of urine. She reports a h/o overactive bladder and was on meds but, since she moved from [place] she has not found anyone to restart her meds.  When queired she reports that she would rather be referred to Urology for a full eval than restarted on meds. She reports that she douched last night with pieces of garlic.    Gynecologic History No LMP recorded. Patient is postmenopausal. Contraception: menopausal state Last Pap: 2009. Results were: normal Last mammogram: 2 months prev. Results were: normal  Obstetric History OB History  Gravida Para Term Preterm AB SAB TAB Ectopic Multiple Living  7 6 6  1  1   6     # Outcome Date GA Lbr Len/2nd Weight Sex Delivery Anes PTL Lv  7 TAB           6 TRM           5 TRM           4 TRM           3 TRM           2 TRM           1 TRM                The following portions of the patient's history were reviewed and updated as appropriate: allergies, current medications, past family history, past medical history, past social history, past surgical history and problem list.  Review of Systems Genitourinary:positive for incontinence and frequency    Objective:    BP 107/61  Pulse 75  Temp(Src) 97.7 F (36.5 C) (Oral)  Wt 149 lb (67.586 kg)  General Appearance:    Alert, cooperative, no distress, appears stated age                    Back:     Symmetric, no curvature, ROM normal, no CVA tenderness  Lungs:     Clear to auscultation bilaterally, respirations unlabored  Chest Wall:    No tenderness or deformity   Heart:    Regular rate and rhythm, S1 and S2 normal, no murmur, rub   or gallop  Breast Exam:    No  tenderness, masses, or nipple abnormality  Abdomen:     Soft, non-tender, bowel sounds active all four quadrants,    no masses, no organomegaly  Genitalia:    Normal female without lesion, discharge or tenderness; pieces of GARLIC found in vaginal vault     Extremities:   Extremities normal, atraumatic, no cyanosis or edema  Pulses:   2+ and symmetric all extremities  Skin:   Skin color, texture, turgor normal, no rashes or lesions  Lymph nodes:   Cervical, supraclavicular, and axillary nodes normal         Assessment:    Healthy female exam.  Incontinence- need referral to Urology    Plan:    Follow up in: 1 year.   F/u PAP smear Pt instructed NOT to douche and definitively not to use garlic

## 2014-07-27 LAB — WET PREP, GENITAL
Clue Cells Wet Prep HPF POC: NONE SEEN
Trich, Wet Prep: NONE SEEN
WBC, Wet Prep HPF POC: NONE SEEN
Yeast Wet Prep HPF POC: NONE SEEN

## 2014-07-29 LAB — CYTOLOGY - PAP

## 2014-07-30 ENCOUNTER — Ambulatory Visit: Payer: Medicare Other | Admitting: Internal Medicine

## 2014-07-30 ENCOUNTER — Ambulatory Visit: Payer: Medicare Other | Attending: Internal Medicine | Admitting: Internal Medicine

## 2014-07-30 ENCOUNTER — Encounter: Payer: Self-pay | Admitting: Internal Medicine

## 2014-07-30 VITALS — BP 156/82 | HR 68 | Temp 97.9°F | Resp 16 | Ht 62.0 in | Wt 151.0 lb

## 2014-07-30 DIAGNOSIS — Z825 Family history of asthma and other chronic lower respiratory diseases: Secondary | ICD-10-CM | POA: Insufficient documentation

## 2014-07-30 DIAGNOSIS — H9191 Unspecified hearing loss, right ear: Secondary | ICD-10-CM

## 2014-07-30 DIAGNOSIS — Z8249 Family history of ischemic heart disease and other diseases of the circulatory system: Secondary | ICD-10-CM | POA: Diagnosis not present

## 2014-07-30 DIAGNOSIS — Z7982 Long term (current) use of aspirin: Secondary | ICD-10-CM | POA: Insufficient documentation

## 2014-07-30 DIAGNOSIS — B009 Herpesviral infection, unspecified: Secondary | ICD-10-CM | POA: Insufficient documentation

## 2014-07-30 DIAGNOSIS — H6120 Impacted cerumen, unspecified ear: Secondary | ICD-10-CM | POA: Diagnosis not present

## 2014-07-30 DIAGNOSIS — J069 Acute upper respiratory infection, unspecified: Secondary | ICD-10-CM | POA: Diagnosis not present

## 2014-07-30 DIAGNOSIS — E785 Hyperlipidemia, unspecified: Secondary | ICD-10-CM | POA: Insufficient documentation

## 2014-07-30 DIAGNOSIS — J45909 Unspecified asthma, uncomplicated: Secondary | ICD-10-CM | POA: Insufficient documentation

## 2014-07-30 DIAGNOSIS — I10 Essential (primary) hypertension: Secondary | ICD-10-CM | POA: Diagnosis not present

## 2014-07-30 DIAGNOSIS — H9190 Unspecified hearing loss, unspecified ear: Secondary | ICD-10-CM | POA: Insufficient documentation

## 2014-07-30 DIAGNOSIS — Z8619 Personal history of other infectious and parasitic diseases: Secondary | ICD-10-CM

## 2014-07-30 DIAGNOSIS — R32 Unspecified urinary incontinence: Secondary | ICD-10-CM | POA: Diagnosis not present

## 2014-07-30 MED ORDER — CARBAMIDE PEROXIDE 6.5 % OT SOLN: 5.0000 [drp] | Freq: Two times a day (BID) | OTIC | Status: DC

## 2014-07-30 MED ORDER — ACYCLOVIR 200 MG PO CAPS: 200.0000 mg | ORAL_CAPSULE | Freq: Every day | ORAL | Status: DC

## 2014-07-30 MED ORDER — AZITHROMYCIN 250 MG PO TABS: ORAL_TABLET | ORAL | Status: DC

## 2014-07-30 NOTE — Patient Instructions (Signed)
DASH Eating Plan °DASH stands for "Dietary Approaches to Stop Hypertension." The DASH eating plan is a healthy eating plan that has been shown to reduce high blood pressure (hypertension). Additional health benefits may include reducing the risk of type 2 diabetes mellitus, heart disease, and stroke. The DASH eating plan may also help with weight loss. °WHAT DO I NEED TO KNOW ABOUT THE DASH EATING PLAN? °For the DASH eating plan, you will follow these general guidelines: °· Choose foods with a percent daily value for sodium of less than 5% (as listed on the food label). °· Use salt-free seasonings or herbs instead of table salt or sea salt. °· Check with your health care provider or pharmacist before using salt substitutes. °· Eat lower-sodium products, often labeled as "lower sodium" or "no salt added." °· Eat fresh foods. °· Eat more vegetables, fruits, and low-fat dairy products. °· Choose whole grains. Look for the word "whole" as the first word in the ingredient list. °· Choose fish and skinless chicken or turkey more often than red meat. Limit fish, poultry, and meat to 6 oz (170 g) each day. °· Limit sweets, desserts, sugars, and sugary drinks. °· Choose heart-healthy fats. °· Limit cheese to 1 oz (28 g) per day. °· Eat more home-cooked food and less restaurant, buffet, and fast food. °· Limit fried foods. °· Cook foods using methods other than frying. °· Limit canned vegetables. If you do use them, rinse them well to decrease the sodium. °· When eating at a restaurant, ask that your food be prepared with less salt, or no salt if possible. °WHAT FOODS CAN I EAT? °Seek help from a dietitian for individual calorie needs. °Grains °Whole grain or whole wheat bread. Brown rice. Whole grain or whole wheat pasta. Quinoa, bulgur, and whole grain cereals. Low-sodium cereals. Corn or whole wheat flour tortillas. Whole grain cornbread. Whole grain crackers. Low-sodium crackers. °Vegetables °Fresh or frozen vegetables  (raw, steamed, roasted, or grilled). Low-sodium or reduced-sodium tomato and vegetable juices. Low-sodium or reduced-sodium tomato sauce and paste. Low-sodium or reduced-sodium canned vegetables.  °Fruits °All fresh, canned (in natural juice), or frozen fruits. °Meat and Other Protein Products °Ground beef (85% or leaner), grass-fed beef, or beef trimmed of fat. Skinless chicken or turkey. Ground chicken or turkey. Pork trimmed of fat. All fish and seafood. Eggs. Dried beans, peas, or lentils. Unsalted nuts and seeds. Unsalted canned beans. °Dairy °Low-fat dairy products, such as skim or 1% milk, 2% or reduced-fat cheeses, low-fat ricotta or cottage cheese, or plain low-fat yogurt. Low-sodium or reduced-sodium cheeses. °Fats and Oils °Tub margarines without trans fats. Light or reduced-fat mayonnaise and salad dressings (reduced sodium). Avocado. Safflower, olive, or canola oils. Natural peanut or almond butter. °Other °Unsalted popcorn and pretzels. °The items listed above may not be a complete list of recommended foods or beverages. Contact your dietitian for more options. °WHAT FOODS ARE NOT RECOMMENDED? °Grains °White bread. White pasta. White rice. Refined cornbread. Bagels and croissants. Crackers that contain trans fat. °Vegetables °Creamed or fried vegetables. Vegetables in a cheese sauce. Regular canned vegetables. Regular canned tomato sauce and paste. Regular tomato and vegetable juices. °Fruits °Dried fruits. Canned fruit in light or heavy syrup. Fruit juice. °Meat and Other Protein Products °Fatty cuts of meat. Ribs, chicken wings, bacon, sausage, bologna, salami, chitterlings, fatback, hot dogs, bratwurst, and packaged luncheon meats. Salted nuts and seeds. Canned beans with salt. °Dairy °Whole or 2% milk, cream, half-and-half, and cream cheese. Whole-fat or sweetened yogurt. Full-fat   cheeses or blue cheese. Nondairy creamers and whipped toppings. Processed cheese, cheese spreads, or cheese  curds. °Condiments °Onion and garlic salt, seasoned salt, table salt, and sea salt. Canned and packaged gravies. Worcestershire sauce. Tartar sauce. Barbecue sauce. Teriyaki sauce. Soy sauce, including reduced sodium. Steak sauce. Fish sauce. Oyster sauce. Cocktail sauce. Horseradish. Ketchup and mustard. Meat flavorings and tenderizers. Bouillon cubes. Hot sauce. Tabasco sauce. Marinades. Taco seasonings. Relishes. °Fats and Oils °Butter, stick margarine, lard, shortening, ghee, and bacon fat. Coconut, palm kernel, or palm oils. Regular salad dressings. °Other °Pickles and olives. Salted popcorn and pretzels. °The items listed above may not be a complete list of foods and beverages to avoid. Contact your dietitian for more information. °WHERE CAN I FIND MORE INFORMATION? °National [employer], [employer], [employer]: www.nhlbi.nih.gov/health/health-topics/topics/dash/ °Document Released: 11/25/2011 Document Revised: 04/22/2014 Document Reviewed: 10/10/2013 °ExitCare® Patient Information ©2015 [employer]. This information is not intended to replace advice given to you by your health care provider. Make sure you discuss any questions you have with your health care provider. ° °

## 2014-07-30 NOTE — Progress Notes (Signed)
MRN: [id number] Name: Regina [name]  Sex: female Age: 73 y.o. DOB: [birth year]  Allergies: Peanuts  Chief Complaint  Patient presents with  . Follow-up  . Hypertension  . Medication Refill    HPI: Patient is 73 y.o. female who has history of hypertension hyperlipidemia comes today for followup, as per patient she is seen gynecologist and is referred to urology for urinary incontinence and already has scheduled appointment, for the last few days she is also having lot of URI symptoms stuffy nose runny nose postnasal drip coughing, she also reported to have been right ear hearing problem and ringing in the ear, patient she used some antibiotic drops in the past, denies any fever chest pain or shortness of breath, today her blood pressure is borderline elevated, she also has history of cervical lymphadenopathy days already following up with general surgery.  Past Medical History  Diagnosis Date  . Asthma   . High cholesterol   . Hypertension     History reviewed. No pertinent past surgical history.    Medication List       This list is accurate as of: 07/30/14  1:05 PM.  Always use your most recent med list.               acyclovir 200 MG capsule  Commonly known as:  ZOVIRAX  Take 1 capsule (200 mg total) by mouth daily.     albuterol 108 (90 BASE) MCG/ACT inhaler  Commonly known as:  PROVENTIL HFA;VENTOLIN HFA  Inhale 2 puffs into the lungs every 6 (six) hours as needed. For shortness of breath/wheezing     aspirin EC 81 MG tablet  Take 81 mg by mouth daily.     atorvastatin 40 MG tablet  Commonly known as:  LIPITOR  Take 1 tablet (40 mg total) by mouth daily.     azithromycin 250 MG tablet  Commonly known as:  ZITHROMAX Z-PAK  Take as directed     benzocaine 10 % mucosal gel  Commonly known as:  ORAJEL  Use as directed 1 application in the mouth or throat as needed for mouth pain.     carbamide peroxide 6.5 % otic solution  Commonly known as:  DEBROX    Place 5 drops into the right ear 2 (two) times daily.     cyclobenzaprine 5 MG tablet  Commonly known as:  FLEXERIL  Take 1 tablet (5 mg total) by mouth daily as needed. For muscle spasms     famotidine 20 MG tablet  Commonly known as:  PEPCID  Take 20 mg by mouth daily.     neomycin-polymyxin-hydrocortisone otic solution  Commonly known as:  CORTISPORIN  Place 3 drops into both ears 4 (four) times daily.     valsartan 160 MG tablet  Commonly known as:  DIOVAN  Take 1 tablet (160 mg total) by mouth daily.        Meds ordered this encounter  Medications  . neomycin-polymyxin-hydrocortisone (CORTISPORIN) otic solution    Sig: Place 3 drops into both ears 4 (four) times daily.  . carbamide peroxide (DEBROX) 6.5 % otic solution    Sig: Place 5 drops into the right ear 2 (two) times daily.    Dispense:  15 mL    Refill:  1  . acyclovir (ZOVIRAX) 200 MG capsule    Sig: Take 1 capsule (200 mg total) by mouth daily.    Dispense:  30 capsule    Refill:  3  .  azithromycin (ZITHROMAX Z-PAK) 250 MG tablet    Sig: Take as directed    Dispense:  6 each    Refill:  0     There is no immunization history on file for this patient.  Family History  Problem Relation Age of Onset  . Heart disease Maternal Grandmother   . Asthma Maternal Grandfather   . Heart disease Maternal Grandfather   . Cancer Mother   . Diabetes Father     History  Substance Use Topics  . Smoking status: Never Smoker   . Smokeless tobacco: Not on file  . Alcohol Use: No    Review of Systems   As noted in HPI  Filed Vitals:   07/30/14 1221  BP: 156/82  Pulse: 68  Temp: 97.9 F (36.6 C)  Resp: 16    Physical Exam  Physical Exam  HENT:  Nasal congestion   Right ear increased Wax/ ?FB can not visualized TM  Eyes: EOM are normal. Pupils are equal, round, and reactive to light.  Cardiovascular: Normal rate and regular rhythm.   Pulmonary/Chest: Breath sounds normal. No respiratory  distress. She has no wheezes. She has no rales.  Musculoskeletal: She exhibits no edema.    CBC    Component Value Date/Time   WBC 5.4 10/27/2012 1308   RBC 4.51 03/01/2014 1143   RBC 4.43 10/27/2012 1308   HGB 12.9 05/25/2014 1702   HCT 38.0 05/25/2014 1702   PLT 188 10/27/2012 1308   MCV 84.4 10/27/2012 1308   LYMPHSABS 2.7 10/27/2012 1308   MONOABS 0.3 10/27/2012 1308   EOSABS 0.2 10/27/2012 1308   BASOSABS 0.0 10/27/2012 1308    CMP     Component Value Date/Time   NA 144 05/25/2014 1702   K 3.9 05/25/2014 1702   CL 99 05/25/2014 1702   CO2 28 04/12/2014 0954   GLUCOSE 80 05/25/2014 1702   BUN 8 05/25/2014 1702   CREATININE 0.90 05/25/2014 1702   CREATININE 0.63 04/12/2014 0954   CALCIUM 9.3 04/12/2014 0954   PROT 6.8 04/12/2014 0954   ALBUMIN 4.1 04/12/2014 0954   AST 22 04/12/2014 0954   ALT 16 04/12/2014 0954   ALKPHOS 69 04/12/2014 0954   BILITOT 0.7 04/12/2014 0954   GFRNONAA >89 04/12/2014 0954   GFRNONAA 89* 10/27/2012 1308   GFRAA >89 04/12/2014 0954   GFRAA >90 10/27/2012 1308    Lab Results  Component Value Date/Time   CHOL 211* 04/12/2014  9:54 AM    No components found with this basename: hga1c    Lab Results  Component Value Date/Time   AST 22 04/12/2014  9:54 AM    Assessment and Plan  Essential hypertension, benign Blood pressure is borderline elevated, I have advised patient for DASH diet, continue with Diovan.  Other and unspecified hyperlipidemia Currently patient is on Lipitor, will repeat fasting lipid panel on next visit.   Hearing reduced, right - Plan: Ambulatory referral to ENT  Wax in ear - Plan: carbamide peroxide (DEBROX) 6.5 % otic solution  History of herpes labialis - Plan: acyclovir (ZOVIRAX) 200 MG capsule  URI (upper respiratory infection) - Plan: azithromycin (ZITHROMAX Z-PAK) 250 MG tablet   Return in about 3 months (around 10/30/2014) for hypertension, hyperipidemia.  Lorayne Marek, MD

## 2014-07-30 NOTE — Progress Notes (Signed)
Pt here to f/u with hypertension with medication management, surgery referral and medication refills Pt need refill on Acyclovir and ATB's for bilateral ears

## 2014-08-02 ENCOUNTER — Ambulatory Visit (INDEPENDENT_AMBULATORY_CARE_PROVIDER_SITE_OTHER): Payer: Medicare Other

## 2014-08-02 VITALS — BP 134/78 | HR 76 | Resp 16

## 2014-08-02 DIAGNOSIS — G629 Polyneuropathy, unspecified: Secondary | ICD-10-CM

## 2014-08-02 DIAGNOSIS — M79606 Pain in leg, unspecified: Secondary | ICD-10-CM

## 2014-08-02 DIAGNOSIS — I739 Peripheral vascular disease, unspecified: Secondary | ICD-10-CM

## 2014-08-02 DIAGNOSIS — G609 Hereditary and idiopathic neuropathy, unspecified: Secondary | ICD-10-CM

## 2014-08-02 DIAGNOSIS — M79609 Pain in unspecified limb: Secondary | ICD-10-CM

## 2014-08-02 MED ORDER — GABAPENTIN 300 MG PO CAPS: 300.0000 mg | ORAL_CAPSULE | Freq: Every day | ORAL | Status: DC

## 2014-08-02 NOTE — Progress Notes (Signed)
Glucose checked at 12:17pm  - 84mg /dl She ate a bowl of oatmeal at 8:30am

## 2014-08-02 NOTE — Patient Instructions (Signed)

## 2014-08-02 NOTE — Progress Notes (Signed)
   Subjective:    Patient ID: DIKSHA TAGLIAFERRO, female    DOB: May 01, 1941, 73 y.o.   MRN: [id number]  HPI Comments: "I have these feelings in my feet"  Patient c/o numbness, tingling, electric shock sensations plantar and toes bilateral, right over left, for about 1 year. Patient states that she has noticed these sensations since they found a nodule on her neck last year. She says that this is supposed to be removed soon. She says when it starts, notices in her hip and shoots down to her foot, mainly the right. Says they also feel cold when this happens She has not seen a doctor for this and no home treatment.  Patient states she is borderline diabetic.  Foot Pain Associated symptoms include myalgias, numbness and weakness.      Review of Systems  Constitutional: Positive for activity change.  HENT: Positive for ear pain.   Eyes: Positive for redness and itching.  Respiratory: Positive for wheezing.   Cardiovascular: Positive for leg swelling.  Endocrine: Positive for polyphagia and polyuria.  Genitourinary: Positive for urgency.  Musculoskeletal: Positive for myalgias.  Neurological: Positive for weakness and numbness.  Hematological: Bruises/bleeds easily.  Psychiatric/Behavioral: The patient is nervous/anxious.   All other systems reviewed and are negative.      Objective:   Physical Exam 73 year old [place] American female presents at this time well-developed well-nourished oriented x3 hasn't complained of abnormal sensation numbness burning in problems with her feet legs. No on for at least a year patient does have prediabetes however has not been monitoring her check your blood sugars.  Lower extremity objective findings vascular status pedal pulses palpable DP plus one over 4 bilateral PT nonpalpable bilateral temperature warm to cool turgor diminished there is no edema mild varicosities noted neurologically epicritic and proprioceptive sensations are diminished with absent  sensation Semmes Weinstein testing to the forefoot toes plantar arch and heel area intact sensation of dorsum of the foot. Vibratory sensation diminished. Random blood glucose today was 84 milligrams per deciliter. Orthopedic biomechanical exam notable HAV deformity no x-rays taken at this time lateral deviation hallux bilateral semirigid digital contractures noted bilateral.       Assessment & Plan:  Assessment this time patient does have peripheral neuropathy may be associated with a prediabetic state also cannot rule out peripheral arterial disease Arkady take symptomology patient has cramping in her legs burning cold temperature and numbness. Will arrange for lower extremity arterial Doppler study vascular evaluation at this time initiate treatment with gabapentin 300 mg each bedtime recheck in 2-3 weeks for reevaluation and basket study results to in nature maintain accommodative shoes avoid entire constrictive.  Harriet Masson DPM

## 2014-08-05 ENCOUNTER — Telehealth (HOSPITAL_COMMUNITY): Payer: Self-pay | Admitting: *Deleted

## 2014-08-22 ENCOUNTER — Ambulatory Visit (HOSPITAL_COMMUNITY)
Admission: RE | Admit: 2014-08-22 | Discharge: 2014-08-22 | Disposition: A | Discharge status: Home / Self Care | Payer: Medicare Other | Source: Ambulatory Visit | Attending: Internal Medicine | Admitting: Internal Medicine

## 2014-08-22 ENCOUNTER — Encounter (HOSPITAL_COMMUNITY): Payer: Medicare Other

## 2014-08-22 DIAGNOSIS — G609 Hereditary and idiopathic neuropathy, unspecified: Secondary | ICD-10-CM | POA: Insufficient documentation

## 2014-08-22 DIAGNOSIS — G629 Polyneuropathy, unspecified: Secondary | ICD-10-CM

## 2014-08-22 DIAGNOSIS — I10 Essential (primary) hypertension: Secondary | ICD-10-CM | POA: Insufficient documentation

## 2014-08-22 DIAGNOSIS — I70219 Atherosclerosis of native arteries of extremities with intermittent claudication, unspecified extremity: Secondary | ICD-10-CM

## 2014-08-22 DIAGNOSIS — I739 Peripheral vascular disease, unspecified: Secondary | ICD-10-CM | POA: Diagnosis not present

## 2014-08-22 DIAGNOSIS — M79609 Pain in unspecified limb: Secondary | ICD-10-CM | POA: Insufficient documentation

## 2014-08-22 DIAGNOSIS — R209 Unspecified disturbances of skin sensation: Secondary | ICD-10-CM | POA: Insufficient documentation

## 2014-08-22 DIAGNOSIS — M79606 Pain in leg, unspecified: Secondary | ICD-10-CM

## 2014-08-22 NOTE — Progress Notes (Signed)
Arterial Duplex Lower Ext. Completed. Glenroy Crossen, BS, RDMS, RVT  

## 2014-09-11 ENCOUNTER — Other Ambulatory Visit (HOSPITAL_COMMUNITY): Payer: Self-pay | Admitting: Urology

## 2014-09-11 DIAGNOSIS — D49519 Neoplasm of unspecified behavior of unspecified kidney: Secondary | ICD-10-CM

## 2014-09-20 ENCOUNTER — Ambulatory Visit (HOSPITAL_COMMUNITY)
Admission: RE | Admit: 2014-09-20 | Discharge: 2014-09-20 | Disposition: A | Discharge status: Home / Self Care | Payer: Medicare Other | Source: Ambulatory Visit | Attending: Urology | Admitting: Urology

## 2014-09-20 DIAGNOSIS — R312 Other microscopic hematuria: Secondary | ICD-10-CM | POA: Insufficient documentation

## 2014-09-20 DIAGNOSIS — D49519 Neoplasm of unspecified behavior of unspecified kidney: Secondary | ICD-10-CM

## 2014-09-20 DIAGNOSIS — D495 Neoplasm of unspecified behavior of other genitourinary organs: Secondary | ICD-10-CM | POA: Insufficient documentation

## 2014-09-20 MED ORDER — GADOBENATE DIMEGLUMINE 529 MG/ML IV SOLN
14.0000 mL | Freq: Once | INTRAVENOUS | Status: AC | PRN
Start: 2014-09-20 — End: 2014-09-20
  Administered 2014-09-20: 14 mL via INTRAVENOUS

## 2014-09-24 MED ORDER — GADOBENATE DIMEGLUMINE 529 MG/ML IV SOLN
15.0000 mL | Freq: Once | INTRAVENOUS | Status: AC | PRN
  Administered 2014-09-24: 15 mL via INTRAVENOUS

## 2014-09-25 ENCOUNTER — Telehealth: Payer: Self-pay | Admitting: Emergency Medicine

## 2014-09-25 ENCOUNTER — Telehealth: Payer: Self-pay | Admitting: Internal Medicine

## 2014-09-25 NOTE — Telephone Encounter (Signed)
Left message that pt will need to schedule OV to further evaluate swelling

## 2014-09-25 NOTE — Telephone Encounter (Signed)
Pt. Called stating that she has not been feeling well, pt sates that she has swelling on hands, feet, and arms. Pt. Would like to speak to a nurse. Please f/u with pt.

## 2014-09-25 NOTE — Telephone Encounter (Signed)
Left message for pt to call nurse line 

## 2014-10-02 ENCOUNTER — Telehealth: Payer: Self-pay | Admitting: Internal Medicine

## 2014-10-02 NOTE — Telephone Encounter (Signed)
Pt. Called stating that she is having swelling and would like to see the doctor, pt. Was advised that we did not have any available appointments for her PCP until the following week but pt. Became upset and stated that we are never able to give her an appt and would like to speak to nurse. Please f/u with pt.

## 2014-10-03 ENCOUNTER — Telehealth: Payer: Self-pay | Admitting: Emergency Medicine

## 2014-10-03 NOTE — Telephone Encounter (Signed)
Left message for pt to call to schedule nurse visit

## 2014-10-21 ENCOUNTER — Other Ambulatory Visit: Payer: Self-pay | Admitting: Urology

## 2014-10-21 DIAGNOSIS — N2889 Other specified disorders of kidney and ureter: Secondary | ICD-10-CM

## 2014-10-23 ENCOUNTER — Encounter: Payer: Self-pay | Admitting: *Deleted

## 2014-10-30 ENCOUNTER — Ambulatory Visit
Admission: RE | Admit: 2014-10-30 | Discharge: 2014-10-30 | Disposition: A | Discharge status: Home / Self Care | Payer: Medicare Other | Source: Ambulatory Visit | Attending: Urology | Admitting: Urology

## 2014-10-30 DIAGNOSIS — N2889 Other specified disorders of kidney and ureter: Secondary | ICD-10-CM

## 2014-10-30 HISTORY — DX: Major depressive disorder, single episode, unspecified: F32.9

## 2014-10-30 HISTORY — DX: Gastro-esophageal reflux disease without esophagitis: K21.9

## 2014-10-30 HISTORY — DX: Chronic kidney disease, unspecified: N18.9

## 2014-10-30 HISTORY — DX: Unspecified osteoarthritis, unspecified site: M19.90

## 2014-10-30 HISTORY — DX: Depression, unspecified: F32.A

## 2014-10-30 HISTORY — DX: Other specified disorders of kidney and ureter: N28.89

## 2014-10-30 NOTE — Consult Note (Signed)
Chief Complaint: Chief Complaint  [name] presents with  . Advice Only    Consult for Cryoablation of Left Renal Mass    Referring Physician(s): Ottelin,Mark C  History of Present Illness: Regina [name] is a 73 y.o. female with detection of a left renal mass by imaging after her workup of microscopic hematuria.  Initial CT of Regina abdomen was performed with and without contrast on 08/20/2014 demonstrating a well-circumscribed indeterminate lesion emanating from Regina anterior left mid renal cortex and estimated at 1.6 cm in maximum diameter.  This was initially studied by ultrasound on 09/10/2014 demonstrating a roughly 1.3 x 1.5 cm hyperechoic solid lesion of Regina left kidney. MRI was performed on 09/20/2014 demonstrating a well-circumscribed, roughly 1.2 cm lesion of Regina anterior left kidney demonstrating mild internal enhancement. Also noted on Regina MRI study was a cystic and solid lesion involving Regina distal tail of Regina pancreas and located adjacent to Regina kidney. Regina whole region of Regina pancreatic tail abnormality measured approximately 2.6 x 1.6 x 2.1 cm.  Regina [name] has not noted any gross hematuria or symptoms referable to Regina left kidney. She does have increased urinary frequency and urgency and has been diagnosed with a low lying/prolapsed bladder.  She complains today of recent significant edema of Regina hands, lower legs, feet and face as well as diffuse itching and a component of rash which she feels may be related to recent prescriptions of Neurontin and Mybetriq.  She has stopped taking both medicines on her own.  Past Medical History  Diagnosis Date  . Asthma   . High cholesterol   . Hypertension   . Chronic kidney disease   . Left renal mass   . Depression   . Arthritis   . Esophageal reflux     No past surgical history on file.  Allergies: Peanuts  Medications: Prior to Admission medications   Medication Sig Start Date End Date Taking? Authorizing Provider    acyclovir (ZOVIRAX) 200 MG capsule Take 1 capsule (200 mg total) by mouth daily. 07/30/14  Yes Lorayne Marek, MD  albuterol (PROVENTIL HFA;VENTOLIN HFA) 108 (90 BASE) MCG/ACT inhaler Inhale 2 puffs into Regina lungs every 6 (six) hours as needed. For shortness of breath/wheezing   Yes Historical Provider, MD  aspirin EC 81 MG tablet Take 81 mg by mouth daily.   Yes Historical Provider, MD  atorvastatin (LIPITOR) 40 MG tablet Take 1 tablet (40 mg total) by mouth daily. 03/01/14  Yes Lorayne Marek, MD  carbamide peroxide (DEBROX) 6.5 % otic solution Place 5 drops into Regina right ear 2 (two) times daily. 07/30/14  Yes Lorayne Marek, MD  cyclobenzaprine (FLEXERIL) 5 MG tablet Take 1 tablet (5 mg total) by mouth daily as needed. For muscle spasms 06/06/14  Yes Lorayne Marek, MD  famotidine (PEPCID) 20 MG tablet Take 20 mg by mouth daily.    Yes Historical Provider, MD  gabapentin (NEURONTIN) 300 MG capsule Take 1 capsule (300 mg total) by mouth at bedtime. 08/02/14  Yes Richard Blenda Mounts, DPM  mirabegron ER (MYRBETRIQ) 50 MG TB24 tablet Take 50 mg by mouth daily.   Yes Historical Provider, MD  valsartan (DIOVAN) 160 MG tablet Take 1 tablet (160 mg total) by mouth daily. 04/12/14  Yes Lorayne Marek, MD  benzocaine (ORAJEL) 10 % mucosal gel Use as directed 1 application in Regina mouth or throat as needed for mouth pain. 04/12/14   Lorayne Marek, MD  neomycin-polymyxin-hydrocortisone (CORTISPORIN) otic solution Place 3 drops into both  ears 4 (four) times daily.    Historical Provider, MD    Family History  Problem Relation Age of Onset  . Heart disease Maternal Grandmother   . Asthma Maternal Grandfather   . Heart disease Maternal Grandfather   . Cancer Mother   . Diabetes Father     History   Social History  . Marital Status: Legally Separated    Spouse Name: N/A    Number of Children: N/A  . Years of Education: N/A   Social History Main Topics  . Smoking status: Never Smoker   . Smokeless tobacco: None   . Alcohol Use: No  . Drug Use: No  . Sexual Activity: No   Other Topics Concern  . None   Social History Narrative    ECOG Status: 1 - Symptomatic but completely ambulatory  Review of Systems: A 12 point ROS discussed and pertinent positives are indicated in Regina HPI above.  All other systems are negative.  Review of Systems  Constitutional: Negative.  Negative for fever and chills.  HENT: Positive for facial swelling.   Respiratory: Negative.   Cardiovascular: Positive for leg swelling. Negative for chest pain and palpitations.  Gastrointestinal: Positive for constipation. Negative for nausea, vomiting, abdominal pain, diarrhea, blood in stool and abdominal distention.  Genitourinary: Positive for urgency and frequency. Negative for dysuria, hematuria, flank pain, difficulty urinating and pelvic pain.  Musculoskeletal: Negative.   Skin: Positive for rash.  Neurological: Negative.     Vital Signs: BP 131/79 mmHg  Pulse 79  Temp(Src) 97.7 F (36.5 C) (Oral)  Resp 15  Ht _0  (1.575 m)  Wt 154 lb (69.854 kg)  BMI 28.16 kg/m2  SpO2 99%  Physical Exam  Constitutional: She appears well-developed and well-nourished. No distress.  Cardiovascular: Normal rate, regular rhythm and normal heart sounds.  Exam reveals no gallop and no friction rub.   No murmur heard. Pulmonary/Chest: Effort normal and breath sounds normal. No respiratory distress. She has no wheezes. She has no rales.  Abdominal: Soft. Bowel sounds are normal. She exhibits no distension and no mass. There is no tenderness. There is no rebound and no guarding.  Musculoskeletal: She exhibits edema.  Edema of hands, feet and lower legs bilaterally.    Imaging: No results found.  Labs:  CBC:  Recent Labs  05/25/14 1702  HGB 12.9  HCT 38.0    COAGS: No results for input(s): INR, APTT in Regina last 8760 hours.  BMP:  Recent Labs  04/12/14 0954 05/25/14 1702  NA 140 144  K 3.7 3.9  CL 101 99  CO2  28  --   GLUCOSE 91 80  BUN 10 8  CALCIUM 9.3  --   CREATININE 0.63 0.90  GFRNONAA >89  --   GFRAA >89  --     LIVER FUNCTION TESTS:  Recent Labs  04/12/14 0954  BILITOT 0.7  AST 22  ALT 16  ALKPHOS 69  PROT 6.8  ALBUMIN 4.1    TUMOR MARKERS: No results for input(s): AFPTM, CEA, CA199, CHROMGRNA in Regina last 8760 hours.  Assessment and Plan:  I met with Regina [name] and reviewed imaging findings with her. Regina enhancing left renal lesion is very well circumscribed, cortical and has an exophytic component anteriorly. By my measurements, maximum lesion diameter is approximately 13 mm.  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  Treatment options were discussed with Regina [name] including continued observation, partial nephrectomy and percutaneous ablation. Details of percutaneous cryoablation were discussed with Regina [name] including risks. This particular lesion is somewhat more difficult to ablate given its anterior location. In addition, Regina [name] does have distended colon containing moderate fecal material abutting Regina lesion. She does state that she has chronic constipation. Ablating this lesion would require likely some type of bowel prep as well as potential hydrodissection to separate Regina colon from Regina margins of Regina lesion and reduce risk of colonic injury.  Renal function is normal.  Regina solid and cystic pancreatic lesion also will need to be followed and potentially worked up. This was difficult to see on Regina prior CT scan due to its extreme posterior location abutting Regina kidney and also Regina inferior aspect of Regina spleen. This does appear to be a real finding by MRI.  This pancreatic lesion may require further workup by Gastroenterology and may be amenable to endoscopic ultrasound  characterization and possible EUS guided biopsy.  My initial recommendation to Regina [name] was imaging follow up with MRI 6 months after Regina previous MRI which would place follow up in approximately early April, 2016. This will be helpful in determining if Regina left renal lesion shows interval growth. Cryoablation of this lesion is not completely straightforward given Regina close proximity of bowel. This would also allow follow up of Regina solid and cystic pancreatic tail lesion.  Regina [name] is agreeable to obtain a follow up MRI in April. I will meet with her then and review imaging findings with her.  Thank you for this interesting consult.  I greatly enjoyed meeting Regina [name] and look forward to participating in their care.  I spent a total of 40 minutes face to face in clinical consultation, greater than 50% of which was counseling/coordinating care for treatment and follow up of Regina left renal mass.   Venetia Night. Kathlene Cote, M.D. Pager:  [phone]

## 2014-10-31 ENCOUNTER — Encounter: Payer: Self-pay | Admitting: Internal Medicine

## 2014-10-31 ENCOUNTER — Ambulatory Visit: Payer: Medicare Other | Attending: Internal Medicine | Admitting: Internal Medicine

## 2014-10-31 VITALS — BP 134/73 | HR 82 | Temp 98.0°F | Resp 16 | Wt 159.4 lb

## 2014-10-31 DIAGNOSIS — I10 Essential (primary) hypertension: Secondary | ICD-10-CM | POA: Diagnosis not present

## 2014-10-31 DIAGNOSIS — R609 Edema, unspecified: Secondary | ICD-10-CM

## 2014-10-31 DIAGNOSIS — M8580 Other specified disorders of bone density and structure, unspecified site: Secondary | ICD-10-CM | POA: Diagnosis not present

## 2014-10-31 DIAGNOSIS — B009 Herpesviral infection, unspecified: Secondary | ICD-10-CM | POA: Insufficient documentation

## 2014-10-31 DIAGNOSIS — Z0000 Encounter for general adult medical examination without abnormal findings: Secondary | ICD-10-CM | POA: Diagnosis not present

## 2014-10-31 DIAGNOSIS — K219 Gastro-esophageal reflux disease without esophagitis: Secondary | ICD-10-CM | POA: Insufficient documentation

## 2014-10-31 DIAGNOSIS — Z7982 Long term (current) use of aspirin: Secondary | ICD-10-CM | POA: Insufficient documentation

## 2014-10-31 DIAGNOSIS — Z8619 Personal history of other infectious and parasitic diseases: Secondary | ICD-10-CM

## 2014-10-31 DIAGNOSIS — E785 Hyperlipidemia, unspecified: Secondary | ICD-10-CM | POA: Diagnosis not present

## 2014-10-31 MED ORDER — FUROSEMIDE 20 MG PO TABS: 20.0000 mg | ORAL_TABLET | Freq: Every day | ORAL | Status: DC

## 2014-10-31 MED ORDER — ATORVASTATIN CALCIUM 40 MG PO TABS: 40.0000 mg | ORAL_TABLET | Freq: Every day | ORAL | Status: DC

## 2014-10-31 MED ORDER — ACYCLOVIR 200 MG PO CAPS: 200.0000 mg | ORAL_CAPSULE | Freq: Every day | ORAL | Status: DC

## 2014-10-31 MED ORDER — ALBUTEROL SULFATE HFA 108 (90 BASE) MCG/ACT IN AERS: 2.0000 | INHALATION_SPRAY | Freq: Four times a day (QID) | RESPIRATORY_TRACT | Status: AC | PRN

## 2014-10-31 NOTE — Progress Notes (Signed)
Patient states here for her physical and in need of medication refills

## 2014-10-31 NOTE — Progress Notes (Signed)
Patient Demographics  Regina [name], is a 73 y.o. female  TUU:[id number]  PHX:[id number]  DOB - 08/22/1941  CC:  Chief Complaint  Patient presents with  . Annual Exam       HPI: Regina [name] is a 73 y.o. female here today for annual physical examination.patient has history of hypertension, anteverted asthma, osteopenia, hyperlipidemia, patient is also following up with the podiatrist, she reported to have some swelling and legs and hands, denies any recent fall or trauma her blood pressure is well controlled and is taking Darvon, she also has history of herpes levels and takes acyclovir, she's requesting refill on her medication. Patient has No headache, No chest pain, No abdominal pain - No Nausea, No new weakness tingling or numbness, No Cough - SOB.  Allergies  Allergen Reactions  . Peanuts [Peanut Oil]    Past Medical History  Diagnosis Date  . Asthma   . High cholesterol   . Hypertension   . Chronic kidney disease   . Left renal mass   . Depression   . Arthritis   . Esophageal reflux    Current Outpatient Prescriptions on File Prior to Visit  Medication Sig Dispense Refill  . aspirin EC 81 MG tablet Take 81 mg by mouth daily.    . benzocaine (ORAJEL) 10 % mucosal gel Use as directed 1 application in the mouth or throat as needed for mouth pain. 5.3 g 0  . carbamide peroxide (DEBROX) 6.5 % otic solution Place 5 drops into the right ear 2 (two) times daily. 15 mL 1  . cyclobenzaprine (FLEXERIL) 5 MG tablet Take 1 tablet (5 mg total) by mouth daily as needed. For muscle spasms 30 tablet 3  . famotidine (PEPCID) 20 MG tablet Take 20 mg by mouth daily.     Marland Kitchen gabapentin (NEURONTIN) 300 MG capsule Take 1 capsule (300 mg total) by mouth at bedtime. 30 capsule 2  . mirabegron ER (MYRBETRIQ) 50 MG TB24 tablet Take 50 mg by mouth daily.    Marland Kitchen neomycin-polymyxin-hydrocortisone (CORTISPORIN) otic solution Place 3 drops into both ears 4 (four) times daily.    . valsartan  (DIOVAN) 160 MG tablet Take 1 tablet (160 mg total) by mouth daily. 30 tablet 5   No current facility-administered medications on file prior to visit.   Family History  Problem Relation Age of Onset  . Heart disease Maternal Grandmother   . Asthma Maternal Grandfather   . Heart disease Maternal Grandfather   . Cancer Mother   . Diabetes Father    History   Social History  . Marital Status: Legally Separated    Spouse Name: N/A    Number of Children: N/A  . Years of Education: N/A   Occupational History  . Not on file.   Social History Main Topics  . Smoking status: Never Smoker   . Smokeless tobacco: Not on file  . Alcohol Use: No  . Drug Use: No  . Sexual Activity: No   Other Topics Concern  . Not on file   Social History Narrative    Review of Systems: Constitutional: Negative for fever, chills, diaphoresis, activity change, appetite change and fatigue. HENT: Negative for ear pain, nosebleeds, congestion, facial swelling, rhinorrhea, neck pain, neck stiffness and ear discharge.  Eyes: Negative for pain, discharge, redness, itching and visual disturbance. Respiratory: Negative for cough, choking, chest tightness, shortness of breath, wheezing and stridor.  Cardiovascular: Negative for chest pain, palpitations and leg swelling. Gastrointestinal: Negative for  abdominal distention. Genitourinary: Negative for dysuria, urgency, frequency, hematuria, flank pain, decreased urine volume, difficulty urinating and dyspareunia.  Musculoskeletal: Negative for back pain, joint swelling, arthralgia and gait problem. Neurological: Negative for dizziness, tremors, seizures, syncope, facial asymmetry, speech difficulty, weakness, light-headedness, numbness and headaches.  Hematological: Negative for adenopathy. Does not bruise/bleed easily. Psychiatric/Behavioral: Negative for hallucinations, behavioral problems, confusion, dysphoric mood, decreased concentration and agitation.     Objective:   Filed Vitals:   10/31/14 0905  BP: 134/73  Pulse: 82  Temp: 98 F (36.7 C)  Resp: 16    Physical Exam: Constitutional: Patient appears well-developed and well-nourished. No distress. HENT: Normocephalic, atraumatic, External right and left ear normal. Oropharynx is clear and moist.  Eyes: Conjunctivae and EOM are normal. PERRLA, no scleral icterus. Neck: Normal ROM. Neck supple. No JVD. No tracheal deviation. No thyromegaly. CVS: RRR, S1/S2 +, no murmurs, no gallops, no carotid bruit.  Pulmonary: Effort and breath sounds normal, no stridor, rhonchi, wheezes, rales.  Abdominal: Soft. BS +, no distension, tenderness, rebound or guarding.  Musculoskeletal: pedal edema  Neuro: Alert. Normal reflexes, muscle tone coordination. No cranial nerve deficit. Skin: Skin is warm and dry. No rash noted. Not diaphoretic. No erythema. No pallor. Psychiatric: Normal mood and affect. Behavior, judgment, thought content normal.  Lab Results  Component Value Date   WBC 5.4 10/27/2012   HGB 12.9 05/25/2014   HCT 38.0 05/25/2014   MCV 84.4 10/27/2012   PLT 188 10/27/2012   Lab Results  Component Value Date   CREATININE 0.90 05/25/2014   BUN 8 05/25/2014   NA 144 05/25/2014   K 3.9 05/25/2014   CL 99 05/25/2014   CO2 28 04/12/2014    No results found for: HGBA1C Lipid Panel     Component Value Date/Time   CHOL 211* 04/12/2014 0954   TRIG 39 04/12/2014 0954   HDL 58 04/12/2014 0954   CHOLHDL 3.6 04/12/2014 0954   VLDL 8 04/12/2014 0954   LDLCALC 145* 04/12/2014 0954       Assessment and plan:   1. Annual physical exam Will repeat her blood chemistry as well as lipid panel.  2. Essential hypertension, benign Blood pressure is well controlled continue Diovan. - COMPLETE METABOLIC PANEL WITH GFR  3. History of herpes labialis  - acyclovir (ZOVIRAX) 200 MG capsule; Take 1 capsule (200 mg total) by mouth daily.  Dispense: 30 capsule; Refill: 3  4.  Hyperlipemia  - atorvastatin (LIPITOR) 40 MG tablet; Take 1 tablet (40 mg total) by mouth daily.  Dispense: 30 tablet; Refill: 3 - Lipid panel  5. Osteopenia Continue with calcium and vitamin d supplements   6. Edema Patient has swelling in hands and hands, prescribed Lasix check for 2 weeks, advise patient for leg elevation and low salt diet, will repeat blood chemistry on the next visit. - furosemide (LASIX) 20 MG tablet; Take 1 tablet (20 mg total) by mouth daily.  Dispense: 14 tablet; Refill: 0       Health Maintenance  -Pap Smear: uptodate  -Mammogram: patient is due will schedule apt last on was 10/2013 -Vaccinations:  uptodate with the flu shot she got at [facility], [place], MD

## 2014-11-01 LAB — COMPLETE METABOLIC PANEL WITH GFR
ALT: 15 U/L (ref 0–35)
AST: 25 U/L (ref 0–37)
Albumin: 4 g/dL (ref 3.5–5.2)
Alkaline Phosphatase: 89 U/L (ref 39–117)
BUN: 12 mg/dL (ref 6–23)
CALCIUM: 9.4 mg/dL (ref 8.4–10.5)
CHLORIDE: 102 meq/L (ref 96–112)
CO2: 26 mEq/L (ref 19–32)
Creat: 0.65 mg/dL (ref 0.50–1.10)
Glucose, Bld: 96 mg/dL (ref 70–99)
Potassium: 4.4 mEq/L (ref 3.5–5.3)
Sodium: 138 mEq/L (ref 135–145)
Total Bilirubin: 0.6 mg/dL (ref 0.2–1.2)
Total Protein: 6.8 g/dL (ref 6.0–8.3)

## 2014-11-01 LAB — LIPID PANEL
Cholesterol: 216 mg/dL — ABNORMAL HIGH (ref 0–200)
HDL: 50 mg/dL (ref >39)
LDL Cholesterol: 152 mg/dL — ABNORMAL HIGH (ref 0–99)
TRIGLYCERIDES: 69 mg/dL (ref <150)
Total CHOL/HDL Ratio: 4.3 Ratio
VLDL: 14 mg/dL (ref 0–40)

## 2014-11-05 ENCOUNTER — Ambulatory Visit (INDEPENDENT_AMBULATORY_CARE_PROVIDER_SITE_OTHER): Payer: Medicare Other

## 2014-11-05 VITALS — BP 156/85 | HR 78 | Resp 13

## 2014-11-05 DIAGNOSIS — R609 Edema, unspecified: Secondary | ICD-10-CM

## 2014-11-05 DIAGNOSIS — G629 Polyneuropathy, unspecified: Secondary | ICD-10-CM

## 2014-11-05 DIAGNOSIS — I739 Peripheral vascular disease, unspecified: Secondary | ICD-10-CM

## 2014-11-05 DIAGNOSIS — I872 Venous insufficiency (chronic) (peripheral): Secondary | ICD-10-CM

## 2014-11-05 DIAGNOSIS — M79606 Pain in leg, unspecified: Secondary | ICD-10-CM

## 2014-11-05 NOTE — Progress Notes (Signed)
   Subjective:    Patient ID: Regina [name], female    DOB: July 23, 1941, 73 y.o.   MRN: [id number]  HPI Comments: Pt states she has been swelling since beginning either the Myribetiq or the Gabapentin.  Pt states she has a cancer on her left kidney and the surgeon took her off the medications.     Review of Systems no new findings or systemic changes noted     Objective:   Physical Exam 73 year old the skin or confusion options at this time does have abdominal mass or pancreatic mass and possibly a bladder cyst or tumor was taken off of her gabapentin due to that she may be scheduled for some surgery in the future. Vascular studies were relatively unremarkable her ABIs were within normal rangeTBIs were also within normal range.. Patient was given a copy of her vascular study. Has significant plus one +2 edema both lower extremities and her hands. Patient has significant fluid retention and edema. Also continues to have paresthesias in both feet although not diabetic may be prediabetic definite peripheral neuropathy symptoms which are likely exacerbated by the edema as well. At this time since her try to avoid any other new medications suggested a chemical means for managing the edema and a prescription for compression stockings is given to the patient.       Assessment & Plan:  Assessment venous insufficiency and peripheral edema both lower extremities history of peripheral neuropathy and neuritis and neuralgia cannot take gabapentin as alternative patient given prescription for compression stockings 20-30 mm compression knee-high stockings are recommended prescription for [name] is issued area follow-up in the future on an as-needed basis if there is any additional problems or changes in status. Again patient not any better as far as her neuropathy or neuralgia as she was not able to take gabapentin  f Harriet Masson DPM

## 2014-11-05 NOTE — Patient Instructions (Signed)
Edema Edema is an abnormal buildup of fluids. It is more common in your legs and thighs. Painless swelling of the feet and ankles is more likely as a person ages. It also is common in looser skin, like around your eyes. HOME CARE   Keep the affected body part above the level of the heart while lying down.  Do not sit still or stand for a long time.  Do not put anything right under your knees when you lie down.  Do not wear tight clothes on your upper legs.  Exercise your legs to help the puffiness (swelling) go down.  Wear elastic bandages or support stockings as told by your doctor.  A low-salt diet may help lessen the puffiness.  Only take medicine as told by your doctor. GET HELP IF:  Treatment is not working.  You have heart, liver, or kidney disease and notice that your skin looks puffy or shiny.  You have puffiness in your legs that does not get better when you raise your legs.  You have sudden weight gain for no reason. GET HELP RIGHT AWAY IF:   You have shortness of breath or chest pain.  You cannot breathe when you lie down.  You have pain, redness, or warmth in the areas that are puffy.  You have heart, liver, or kidney disease and get edema all of a sudden.  You have a fever and your symptoms get worse all of a sudden. MAKE SURE YOU:   Understand these instructions.  Will watch your condition.  Will get help right away if you are not doing well or get worse. Document Released: 05/24/2008 Document Revised: 12/11/2013 Document Reviewed: 09/28/2013 [facility] Patient Information 2015 [place], [place]. This information is not intended to replace advice given to you by your health care provider. Make sure you discuss any questions you have with your health care provider.  Recommend compression stockings either knee-high or thigh-high. Can be obtained at [facility] with prescription provided

## 2014-11-13 ENCOUNTER — Telehealth: Payer: Self-pay | Admitting: Podiatry

## 2014-11-13 NOTE — Telephone Encounter (Signed)
Pt requests renewal of stocking for compression and new shoes.

## 2014-11-13 NOTE — Consult Note (Signed)
Review of Systems  Physical Exam

## 2014-11-18 ENCOUNTER — Telehealth: Payer: Self-pay | Admitting: *Deleted

## 2014-11-18 NOTE — Telephone Encounter (Signed)
Apply any hand cream or lotion to the feet or legs twice daily as instructed can obtain carry lotion or Eucerin cream. These types of creams or lotions or OTC products/nonprescription  Harriet Masson DPM

## 2014-11-18 NOTE — Telephone Encounter (Addendum)
Pt states she would like a cream for her legs the swelling has gone down.  I called Dr. Erasmo Downer orders to pt's phone and left message.

## 2014-11-18 NOTE — Telephone Encounter (Signed)
Delydia, can you see if Diabetic shoe form in Dr. Phoebe Perch box in your office. Marcy Siren

## 2014-11-18 NOTE — Telephone Encounter (Signed)
Patient can obtain stockings anytime she wants does not need another prescription I prescribe those to her just 2 weeks ago. If she is interested in diabetic shoes will need to get authorization from her primary physician. If she is not scheduled for follow-up get her scheduled for follow-up appointment once authorization obtained  Harriet Masson DPM

## 2014-11-19 ENCOUNTER — Telehealth: Payer: Self-pay | Admitting: *Deleted

## 2014-11-19 ENCOUNTER — Telehealth: Payer: Self-pay | Admitting: Internal Medicine

## 2014-11-19 NOTE — Telephone Encounter (Signed)
I left the patient a message to call me back on tomorrow.

## 2014-11-19 NOTE — Telephone Encounter (Signed)
I left a message for the patient to call me back.  Dr. Blenda Mounts said patient cannot get Diabetic shoes because she is not Diabetic, does not meet criteria.

## 2014-11-19 NOTE — Telephone Encounter (Signed)
Service Coordinator has questions regarding pt's medical situation and would like to speak to nurse regarding pending orders. Please f/u with pt's coordinator.

## 2014-11-20 NOTE — Telephone Encounter (Signed)
I called and informed the patient that she does not meet the criteria for Diabetic shoes.  I asked her if she is Diabetic.  "I'm border-line Diabetic.  I called Medicare and they said I could get the shoes.  They must have misunderstood me."  I told her you would have to have Diabetes and your primary care doctor would have to fill out a letter of medical necessity.  "Okay, thank you."

## 2014-11-26 ENCOUNTER — Emergency Department (HOSPITAL_COMMUNITY)
Admission: EM | Admit: 2014-11-26 | Discharge: 2014-11-26 | Disposition: A | Discharge status: Home / Self Care | Payer: Medicare Other | Attending: Emergency Medicine | Admitting: Emergency Medicine

## 2014-11-26 ENCOUNTER — Encounter (HOSPITAL_COMMUNITY): Payer: Self-pay | Admitting: Physical Medicine and Rehabilitation

## 2014-11-26 DIAGNOSIS — I129 Hypertensive chronic kidney disease with stage 1 through stage 4 chronic kidney disease, or unspecified chronic kidney disease: Secondary | ICD-10-CM | POA: Diagnosis not present

## 2014-11-26 DIAGNOSIS — Z79899 Other long term (current) drug therapy: Secondary | ICD-10-CM | POA: Diagnosis not present

## 2014-11-26 DIAGNOSIS — E780 Pure hypercholesterolemia: Secondary | ICD-10-CM | POA: Diagnosis not present

## 2014-11-26 DIAGNOSIS — K219 Gastro-esophageal reflux disease without esophagitis: Secondary | ICD-10-CM | POA: Diagnosis not present

## 2014-11-26 DIAGNOSIS — Z8659 Personal history of other mental and behavioral disorders: Secondary | ICD-10-CM | POA: Insufficient documentation

## 2014-11-26 DIAGNOSIS — R609 Edema, unspecified: Secondary | ICD-10-CM

## 2014-11-26 DIAGNOSIS — J45909 Unspecified asthma, uncomplicated: Secondary | ICD-10-CM | POA: Insufficient documentation

## 2014-11-26 DIAGNOSIS — R600 Localized edema: Secondary | ICD-10-CM | POA: Insufficient documentation

## 2014-11-26 DIAGNOSIS — M1990 Unspecified osteoarthritis, unspecified site: Secondary | ICD-10-CM | POA: Diagnosis not present

## 2014-11-26 DIAGNOSIS — N189 Chronic kidney disease, unspecified: Secondary | ICD-10-CM | POA: Insufficient documentation

## 2014-11-26 DIAGNOSIS — Z7982 Long term (current) use of aspirin: Secondary | ICD-10-CM | POA: Diagnosis not present

## 2014-11-26 LAB — CBC WITH DIFFERENTIAL/PLATELET
Basophils Absolute: 0 10*3/uL (ref 0.0–0.1)
Basophils Relative: 1 % (ref 0–1)
Eosinophils Absolute: 0.9 10*3/uL — ABNORMAL HIGH (ref 0.0–0.7)
Eosinophils Relative: 16 % — ABNORMAL HIGH (ref 0–5)
HCT: 34.6 % — ABNORMAL LOW (ref 36.0–46.0)
Hemoglobin: 11.1 g/dL — ABNORMAL LOW (ref 12.0–15.0)
Lymphocytes Relative: 24 % (ref 12–46)
Lymphs Abs: 1.3 10*3/uL (ref 0.7–4.0)
MCH: 25.8 pg — AB (ref 26.0–34.0)
MCHC: 32.1 g/dL (ref 30.0–36.0)
MCV: 80.5 fL (ref 78.0–100.0)
MONOS PCT: 6 % (ref 3–12)
Monocytes Absolute: 0.3 10*3/uL (ref 0.1–1.0)
Neutro Abs: 2.8 10*3/uL (ref 1.7–7.7)
Neutrophils Relative %: 53 % (ref 43–77)
Platelets: 225 10*3/uL (ref 150–400)
RBC: 4.3 MIL/uL (ref 3.87–5.11)
RDW: 14.7 % (ref 11.5–15.5)
WBC: 5.3 10*3/uL (ref 4.0–10.5)

## 2014-11-26 LAB — HIV ANTIBODY (ROUTINE TESTING W REFLEX): HIV 1&2 Ab, 4th Generation: NONREACTIVE

## 2014-11-26 LAB — COMPREHENSIVE METABOLIC PANEL
ALT: 18 U/L (ref 0–35)
AST: 28 U/L (ref 0–37)
Albumin: 3.6 g/dL (ref 3.5–5.2)
Alkaline Phosphatase: 108 U/L (ref 39–117)
Anion gap: 11 (ref 5–15)
BUN: 12 mg/dL (ref 6–23)
CO2: 27 meq/L (ref 19–32)
Calcium: 9.3 mg/dL (ref 8.4–10.5)
Chloride: 102 mEq/L (ref 96–112)
Creatinine, Ser: 0.65 mg/dL (ref 0.50–1.10)
GFR calc Af Amer: >90 mL/min (ref >90)
GFR calc non Af Amer: 87 mL/min — ABNORMAL LOW (ref >90)
Glucose, Bld: 89 mg/dL (ref 70–99)
Potassium: 3.9 mEq/L (ref 3.7–5.3)
SODIUM: 140 meq/L (ref 137–147)
TOTAL PROTEIN: 7 g/dL (ref 6.0–8.3)
Total Bilirubin: 0.4 mg/dL (ref 0.3–1.2)

## 2014-11-26 LAB — URINALYSIS, ROUTINE W REFLEX MICROSCOPIC
Bilirubin Urine: NEGATIVE
GLUCOSE, UA: NEGATIVE mg/dL
KETONES UR: NEGATIVE mg/dL
Leukocytes, UA: NEGATIVE
Nitrite: NEGATIVE
PROTEIN: NEGATIVE mg/dL
Specific Gravity, Urine: 1.007 (ref 1.005–1.030)
Urobilinogen, UA: 0.2 mg/dL (ref 0.0–1.0)
pH: 6.5 (ref 5.0–8.0)

## 2014-11-26 LAB — CBG MONITORING, ED: Glucose-Capillary: 91 mg/dL (ref 70–99)

## 2014-11-26 LAB — URINE MICROSCOPIC-ADD ON

## 2014-11-26 LAB — RPR

## 2014-11-26 MED ORDER — FUROSEMIDE 20 MG PO TABS: 20.0000 mg | ORAL_TABLET | Freq: Two times a day (BID) | ORAL | Status: DC

## 2014-11-26 MED ORDER — POTASSIUM CHLORIDE CRYS ER 20 MEQ PO TBCR: 20.0000 meq | EXTENDED_RELEASE_TABLET | Freq: Every day | ORAL | Status: DC

## 2014-11-26 NOTE — Discharge Instructions (Signed)
Take furosemide 20 mg twice a day. You can use your old prescription, until it runs out. Call your Dr. for a follow-up appointment next week.     Peripheral Edema You have swelling in your legs (peripheral edema). This swelling is due to excess accumulation of salt and water in your body. Edema may be a sign of heart, kidney or liver disease, or a side effect of a medication. It may also be due to problems in the leg veins. Elevating your legs and using special support stockings may be very helpful, if the cause of the swelling is due to poor venous circulation. Avoid long periods of standing, whatever the cause. Treatment of edema depends on identifying the cause. Chips, pretzels, pickles and other salty foods should be avoided. Restricting salt in your diet is almost always needed. Water pills (diuretics) are often used to remove the excess salt and water from your body via urine. These medicines prevent the kidney from reabsorbing sodium. This increases urine flow. Diuretic treatment may also result in lowering of potassium levels in your body. Potassium supplements may be needed if you have to use diuretics daily. Daily weights can help you keep track of your progress in clearing your edema. You should call your caregiver for follow up care as recommended. SEEK IMMEDIATE MEDICAL CARE IF:   You have increased swelling, pain, redness, or heat in your legs.  You develop shortness of breath, especially when lying down.  You develop chest or abdominal pain, weakness, or fainting.  You have a fever. Document Released: 01/13/2005 Document Revised: 02/28/2012 Document Reviewed: 12/24/2009 [facility] Patient Information 2015 [place], [place]. This information is not intended to replace advice given to you by your health care provider. Make sure you discuss any questions you have with your health care provider.

## 2014-11-26 NOTE — ED Provider Notes (Signed)
CSN: [id number]     Arrival date & time 11/26/14  1217 History   First MD Initiated Contact with Patient 11/26/14 1355     Chief Complaint  Patient presents with  . Edema  . Leg Pain     (Consider location/radiation/quality/duration/timing/severity/associated sxs/prior Treatment) HPI   Regina [name] is a 73 y.o. female presents for evaluation of swelling in legs for one month.  She also has a rash of her legs and palms.  He denies chest pain, shortness of breath, fever, chills, cough, nausea or vomiting.  She is taking her usual medications, without relief.  There are no other known modifying factors.   Past Medical History  Diagnosis Date  . Asthma   . High cholesterol   . Hypertension   . Chronic kidney disease   . Left renal mass   . Depression   . Arthritis   . Esophageal reflux    History reviewed. No pertinent past surgical history. Family History  Problem Relation Age of Onset  . Heart disease Maternal Grandmother   . Asthma Maternal Grandfather   . Heart disease Maternal Grandfather   . Cancer Mother   . Diabetes Father    History  Substance Use Topics  . Smoking status: Never Smoker   . Smokeless tobacco: Not on file  . Alcohol Use: No   OB History    Gravida Para Term Preterm AB TAB SAB Ectopic Multiple Living   7 6 6  1 1    6      Review of Systems  All other systems reviewed and are negative.     Allergies  Peanuts  Home Medications   Prior to Admission medications   Medication Sig Start Date End Date Taking? Authorizing Provider  acyclovir (ZOVIRAX) 200 MG capsule Take 1 capsule (200 mg total) by mouth daily. 10/31/14  Yes Lorayne Marek, MD  albuterol (PROVENTIL HFA;VENTOLIN HFA) 108 (90 BASE) MCG/ACT inhaler Inhale 2 puffs into the lungs every 6 (six) hours as needed. For shortness of breath/wheezing 10/31/14  Yes Lorayne Marek, MD  aspirin EC 81 MG tablet Take 81 mg by mouth daily.   Yes Historical Provider, MD  atorvastatin (LIPITOR)  40 MG tablet Take 1 tablet (40 mg total) by mouth daily. 10/31/14  Yes Lorayne Marek, MD  CALCIUM PO Take by mouth.   Yes Historical Provider, MD  cholecalciferol (VITAMIN D) 1000 UNITS tablet Take 1,000 Units by mouth daily.   Yes Historical Provider, MD  cyclobenzaprine (FLEXERIL) 5 MG tablet Take 1 tablet (5 mg total) by mouth daily as needed. For muscle spasms 06/06/14  Yes Lorayne Marek, MD  famotidine (PEPCID) 20 MG tablet Take 20 mg by mouth daily.    Yes Historical Provider, MD  UNKNOWN TO PATIENT    Yes Historical Provider, MD  valsartan (DIOVAN) 160 MG tablet Take 1 tablet (160 mg total) by mouth daily. 04/12/14  Yes Lorayne Marek, MD  furosemide (LASIX) 20 MG tablet Take 1 tablet (20 mg total) by mouth 2 (two) times daily. 11/26/14   Richarda Blade, MD  gabapentin (NEURONTIN) 300 MG capsule Take 1 capsule (300 mg total) by mouth at bedtime. Patient not taking: Reported on 11/26/2014 08/02/14   Harriet Masson, DPM  potassium chloride SA (K-DUR,KLOR-CON) 20 MEQ tablet Take 1 tablet (20 mEq total) by mouth daily. 11/26/14   Richarda Blade, MD   BP 130/69 mmHg  Pulse 65  Temp(Src) 98.2 F (36.8 C) (Oral)  Resp 15  SpO2  98% Physical Exam  Constitutional: She is oriented to person, place, and time. She appears well-developed.  Elderly, frail  HENT:  Head: Normocephalic and atraumatic.  Right Ear: External ear normal.  Left Ear: External ear normal.  Eyes: Conjunctivae and EOM are normal. Pupils are equal, round, and reactive to light.  Neck: Normal range of motion and phonation normal. Neck supple.  Cardiovascular: Normal rate, regular rhythm and normal heart sounds.   Pulmonary/Chest: Effort normal and breath sounds normal. No respiratory distress. She has no wheezes. She has no rales. She exhibits no bony tenderness.  Abdominal: Soft. There is no tenderness.  Musculoskeletal: Normal range of motion. She exhibits edema (3+ bilateral lower extremities and 1+, hands.).  Neurological:  She is alert and oriented to person, place, and time. No cranial nerve deficit or sensory deficit. She exhibits normal muscle tone. Coordination normal.  Skin: Skin is warm, dry and intact.  Psychiatric: She has a normal mood and affect. Her behavior is normal. Judgment and thought content normal.  Nursing note and vitals reviewed.   ED Course  Procedures (including critical care time)  Medications - No data to display  Patient Vitals for the past 24 hrs:  BP Temp Temp src Pulse Resp SpO2  11/26/14 1635 - 98.2 F (36.8 C) - - - -  11/26/14 1615 130/69 mmHg - - - 15 -  11/26/14 1600 130/79 mmHg - - 65 - 98 %  11/26/14 1545 (!) 119/52 mmHg - - 67 14 100 %  11/26/14 1530 115/69 mmHg - - 63 17 100 %  11/26/14 1524 124/62 mmHg 98.1 F (36.7 C) Oral 65 16 99 %  11/26/14 1515 124/62 mmHg - - - 13 -  11/26/14 1500 127/66 mmHg - - - 18 -  11/26/14 1233 122/72 mmHg 98 F (36.7 C) Oral 85 18 100 %       Labs Review Labs Reviewed  COMPREHENSIVE METABOLIC PANEL - Abnormal; Notable for the following:    GFR calc non Af Amer 87 (*)    All other components within normal limits  CBC WITH DIFFERENTIAL - Abnormal; Notable for the following:    Hemoglobin 11.1 (*)    HCT 34.6 (*)    MCH 25.8 (*)    Eosinophils Relative 16 (*)    Eosinophils Absolute 0.9 (*)    All other components within normal limits  URINALYSIS, ROUTINE W REFLEX MICROSCOPIC - Abnormal; Notable for the following:    Hgb urine dipstick SMALL (*)    All other components within normal limits  URINE CULTURE  URINE MICROSCOPIC-ADD ON  RPR  HIV ANTIBODY (ROUTINE TESTING)  CBG MONITORING, ED    Imaging Review No results found.   EKG Interpretation None      MDM   Final diagnoses:  Peripheral edema    Nonspecific edema; without acute CHF or metabolic instability. Doubt ACS.   Nursing Notes Reviewed/ Care Coordinated Applicable Imaging Reviewed Interpretation of Laboratory Data incorporated into ED  treatment  The patient appears reasonably screened and/or stabilized for discharge and I doubt any other medical condition or other [facility] requiring further screening, evaluation, or treatment in the ED at this time prior to discharge.  Plan: Home Medications- Increase Lasix to BID, add Potassium; Home Treatments- rest; return here if the recommended treatment, does not improve the symptoms; Recommended follow up- PCP 1 week; f/u on RPR and HIV    Richarda Blade, MD 11/26/14 1954

## 2014-11-26 NOTE — ED Notes (Signed)
Pt reports swelling to bilateral hands, legs and feet. Also reports rash to bilateral legs. Ongoing x1 year. Respirations unlabored. Pt is alert and oriented x4.

## 2014-11-27 LAB — URINE CULTURE: Colony Count: 30000

## 2014-12-23 ENCOUNTER — Other Ambulatory Visit: Payer: Self-pay

## 2014-12-23 DIAGNOSIS — Z1231 Encounter for screening mammogram for malignant neoplasm of breast: Secondary | ICD-10-CM

## 2014-12-30 ENCOUNTER — Other Ambulatory Visit: Payer: Self-pay | Admitting: *Deleted

## 2014-12-30 ENCOUNTER — Other Ambulatory Visit: Payer: Self-pay | Admitting: Internal Medicine

## 2014-12-30 MED ORDER — FUROSEMIDE 20 MG PO TABS: 20.0000 mg | ORAL_TABLET | Freq: Two times a day (BID) | ORAL | Status: DC

## 2014-12-30 MED ORDER — POTASSIUM CHLORIDE CRYS ER 20 MEQ PO TBCR: 20.0000 meq | EXTENDED_RELEASE_TABLET | Freq: Every day | ORAL | Status: DC

## 2014-12-30 NOTE — Telephone Encounter (Signed)
Rx was refills Pt aware, advised to maintain appointment with PCP

## 2014-12-30 NOTE — Telephone Encounter (Signed)
Pt has ED f/u appt on 01/07/14 but says she is out of meds. Pt requesting refill on "fluid pill" and pain medication. Please f/u with pt.

## 2015-01-07 ENCOUNTER — Encounter: Payer: Self-pay | Admitting: Internal Medicine

## 2015-01-07 ENCOUNTER — Ambulatory Visit: Payer: Medicare Other | Attending: Internal Medicine | Admitting: Internal Medicine

## 2015-01-07 VITALS — BP 159/90 | HR 80 | Temp 97.6°F | Resp 15 | Wt 155.2 lb

## 2015-01-07 DIAGNOSIS — R7309 Other abnormal glucose: Secondary | ICD-10-CM | POA: Diagnosis not present

## 2015-01-07 DIAGNOSIS — R05 Cough: Secondary | ICD-10-CM | POA: Diagnosis not present

## 2015-01-07 DIAGNOSIS — J45909 Unspecified asthma, uncomplicated: Secondary | ICD-10-CM | POA: Diagnosis not present

## 2015-01-07 DIAGNOSIS — R609 Edema, unspecified: Secondary | ICD-10-CM | POA: Insufficient documentation

## 2015-01-07 DIAGNOSIS — R062 Wheezing: Secondary | ICD-10-CM | POA: Diagnosis not present

## 2015-01-07 DIAGNOSIS — E785 Hyperlipidemia, unspecified: Secondary | ICD-10-CM | POA: Insufficient documentation

## 2015-01-07 DIAGNOSIS — I10 Essential (primary) hypertension: Secondary | ICD-10-CM | POA: Insufficient documentation

## 2015-01-07 DIAGNOSIS — M2550 Pain in unspecified joint: Secondary | ICD-10-CM | POA: Insufficient documentation

## 2015-01-07 DIAGNOSIS — R059 Cough, unspecified: Secondary | ICD-10-CM

## 2015-01-07 DIAGNOSIS — Z8619 Personal history of other infectious and parasitic diseases: Secondary | ICD-10-CM

## 2015-01-07 DIAGNOSIS — R7303 Prediabetes: Secondary | ICD-10-CM

## 2015-01-07 LAB — COMPLETE METABOLIC PANEL WITH GFR
ALBUMIN: 3.9 g/dL (ref 3.5–5.2)
ALT: 20 U/L (ref 0–35)
AST: 24 U/L (ref 0–37)
Alkaline Phosphatase: 97 U/L (ref 39–117)
BILIRUBIN TOTAL: 0.6 mg/dL (ref 0.2–1.2)
BUN: 13 mg/dL (ref 6–23)
CHLORIDE: 100 meq/L (ref 96–112)
CO2: 27 mEq/L (ref 19–32)
CREATININE: 0.72 mg/dL (ref 0.50–1.10)
Calcium: 9.3 mg/dL (ref 8.4–10.5)
GFR, Est African American: >89 mL/min
GFR, Est Non African American: 85 mL/min
Glucose, Bld: 91 mg/dL (ref 70–99)
Potassium: 4 mEq/L (ref 3.5–5.3)
SODIUM: 138 meq/L (ref 135–145)
Total Protein: 6.9 g/dL (ref 6.0–8.3)

## 2015-01-07 LAB — RHEUMATOID FACTOR: Rhuematoid fact SerPl-aCnc: <10 IU/mL (ref <=14)

## 2015-01-07 LAB — URIC ACID: URIC ACID, SERUM: 5.8 mg/dL (ref 2.4–7.0)

## 2015-01-07 LAB — POCT GLYCOSYLATED HEMOGLOBIN (HGB A1C): HEMOGLOBIN A1C: 5.9

## 2015-01-07 MED ORDER — ACYCLOVIR 200 MG PO CAPS: 200.0000 mg | ORAL_CAPSULE | Freq: Every day | ORAL | Status: DC

## 2015-01-07 MED ORDER — ATORVASTATIN CALCIUM 40 MG PO TABS: 40.0000 mg | ORAL_TABLET | Freq: Every day | ORAL | Status: AC

## 2015-01-07 NOTE — Patient Instructions (Signed)
Diabetes Mellitus and Food It is important for you to manage your blood sugar (glucose) level. Your blood glucose level can be greatly affected by what you eat. Eating healthier foods in the appropriate amounts throughout the day at about the same time each day will help you control your blood glucose level. It can also help slow or prevent worsening of your diabetes mellitus. Healthy eating may even help you improve the level of your blood pressure and reach or maintain a healthy weight.  HOW CAN FOOD AFFECT ME? Carbohydrates Carbohydrates affect your blood glucose level more than any other type of food. Your dietitian will help you determine how many carbohydrates to eat at each meal and teach you how to count carbohydrates. Counting carbohydrates is important to keep your blood glucose at a healthy level, especially if you are using insulin or taking certain medicines for diabetes mellitus. Alcohol Alcohol can cause sudden decreases in blood glucose (hypoglycemia), especially if you use insulin or take certain medicines for diabetes mellitus. Hypoglycemia can be a life-threatening condition. Symptoms of hypoglycemia (sleepiness, dizziness, and disorientation) are similar to symptoms of having too much alcohol.  If your health care provider has given you approval to drink alcohol, do so in moderation and use the following guidelines:  Women should not have more than one drink per day, and men should not have more than two drinks per day. One drink is equal to:  12 oz of beer.  5 oz of wine.  1 oz of hard liquor.  Do not drink on an empty stomach.  Keep yourself hydrated. Have water, diet soda, or unsweetened iced tea.  Regular soda, juice, and other mixers might contain a lot of carbohydrates and should be counted. WHAT FOODS ARE NOT RECOMMENDED? As you make food choices, it is important to remember that all foods are not the same. Some foods have fewer nutrients per serving than other  foods, even though they might have the same number of calories or carbohydrates. It is difficult to get your body what it needs when you eat foods with fewer nutrients. Examples of foods that you should avoid that are high in calories and carbohydrates but low in nutrients include:  Trans fats (most processed foods list trans fats on the Nutrition Facts label).  Regular soda.  Juice.  Candy.  Sweets, such as cake, pie, doughnuts, and cookies.  Fried foods. WHAT FOODS CAN I EAT? Have nutrient-rich foods, which will nourish your body and keep you healthy. The food you should eat also will depend on several factors, including:  The calories you need.  The medicines you take.  Your weight.  Your blood glucose level.  Your blood pressure level.  Your cholesterol level. You also should eat a variety of foods, including:  Protein, such as meat, poultry, fish, tofu, nuts, and seeds (lean animal proteins are best).  Fruits.  Vegetables.  Dairy products, such as milk, cheese, and yogurt (low fat is best).  Breads, grains, pasta, cereal, rice, and beans.  Fats such as olive oil, trans fat-free margarine, canola oil, avocado, and olives. DOES EVERYONE WITH DIABETES MELLITUS HAVE THE SAME MEAL PLAN? Because every person with diabetes mellitus is different, there is not one meal plan that works for everyone. It is very important that you meet with a dietitian who will help you create a meal plan that is just right for you. Document Released: 09/02/2005 Document Revised: 12/11/2013 Document Reviewed: 11/02/2013 ExitCare Patient Information 2015 [employer]. This   information is not intended to replace advice given to you by your health care provider. Make sure you discuss any questions you have with your health care provider. DASH Eating Plan DASH stands for "Dietary Approaches to Stop Hypertension." The DASH eating plan is a healthy eating plan that has been shown to reduce high  blood pressure (hypertension). Additional health benefits may include reducing the risk of type 2 diabetes mellitus, heart disease, and stroke. The DASH eating plan may also help with weight loss. WHAT DO I NEED TO KNOW ABOUT THE DASH EATING PLAN? For the DASH eating plan, you will follow these general guidelines:  Choose foods with a percent daily value for sodium of less than 5% (as listed on the food label).  Use salt-free seasonings or herbs instead of table salt or sea salt.  Check with your health care provider or pharmacist before using salt substitutes.  Eat lower-sodium products, often labeled as "lower sodium" or "no salt added."  Eat fresh foods.  Eat more vegetables, fruits, and low-fat dairy products.  Choose whole grains. Look for the word "whole" as the first word in the ingredient list.  Choose fish and skinless chicken or turkey more often than red meat. Limit fish, poultry, and meat to 6 oz (170 g) each day.  Limit sweets, desserts, sugars, and sugary drinks.  Choose heart-healthy fats.  Limit cheese to 1 oz (28 g) per day.  Eat more home-cooked food and less restaurant, buffet, and fast food.  Limit fried foods.  Cook foods using methods other than frying.  Limit canned vegetables. If you do use them, rinse them well to decrease the sodium.  When eating at a restaurant, ask that your food be prepared with less salt, or no salt if possible. WHAT FOODS CAN I EAT? Seek help from a dietitian for individual calorie needs. Grains Whole grain or whole wheat bread. Brown rice. Whole grain or whole wheat pasta. Quinoa, bulgur, and whole grain cereals. Low-sodium cereals. Corn or whole wheat flour tortillas. Whole grain cornbread. Whole grain crackers. Low-sodium crackers. Vegetables Fresh or frozen vegetables (raw, steamed, roasted, or grilled). Low-sodium or reduced-sodium tomato and vegetable juices. Low-sodium or reduced-sodium tomato sauce and paste. Low-sodium  or reduced-sodium canned vegetables.  Fruits All fresh, canned (in natural juice), or frozen fruits. Meat and Other Protein Products Ground beef (85% or leaner), grass-fed beef, or beef trimmed of fat. Skinless chicken or turkey. Ground chicken or turkey. Pork trimmed of fat. All fish and seafood. Eggs. Dried beans, peas, or lentils. Unsalted nuts and seeds. Unsalted canned beans. Dairy Low-fat dairy products, such as skim or 1% milk, 2% or reduced-fat cheeses, low-fat ricotta or cottage cheese, or plain low-fat yogurt. Low-sodium or reduced-sodium cheeses. Fats and Oils Tub margarines without trans fats. Light or reduced-fat mayonnaise and salad dressings (reduced sodium). Avocado. Safflower, olive, or canola oils. Natural peanut or almond butter. Other Unsalted popcorn and pretzels. The items listed above may not be a complete list of recommended foods or beverages. Contact your dietitian for more options. WHAT FOODS ARE NOT RECOMMENDED? Grains White bread. White pasta. White rice. Refined cornbread. Bagels and croissants. Crackers that contain trans fat. Vegetables Creamed or fried vegetables. Vegetables in a cheese sauce. Regular canned vegetables. Regular canned tomato sauce and paste. Regular tomato and vegetable juices. Fruits Dried fruits. Canned fruit in light or heavy syrup. Fruit juice. Meat and Other Protein Products Fatty cuts of meat. Ribs, chicken wings, bacon, sausage, bologna, salami, chitterlings, fatback, hot   dogs, bratwurst, and packaged luncheon meats. Salted nuts and seeds. Canned beans with salt. Dairy Whole or 2% milk, cream, half-and-half, and cream cheese. Whole-fat or sweetened yogurt. Full-fat cheeses or blue cheese. Nondairy creamers and whipped toppings. Processed cheese, cheese spreads, or cheese curds. Condiments Onion and garlic salt, seasoned salt, table salt, and sea salt. Canned and packaged gravies. Worcestershire sauce. Tartar sauce. Barbecue sauce.  Teriyaki sauce. Soy sauce, including reduced sodium. Steak sauce. Fish sauce. Oyster sauce. Cocktail sauce. Horseradish. Ketchup and mustard. Meat flavorings and tenderizers. Bouillon cubes. Hot sauce. Tabasco sauce. Marinades. Taco seasonings. Relishes. Fats and Oils Butter, stick margarine, lard, shortening, ghee, and bacon fat. Coconut, palm kernel, or palm oils. Regular salad dressings. Other Pickles and olives. Salted popcorn and pretzels. The items listed above may not be a complete list of foods and beverages to avoid. Contact your dietitian for more information. WHERE CAN I FIND MORE INFORMATION? National Heart, [employer], and [employer] Institute: www.nhlbi.nih.gov/health/health-topics/topics/dash/ Document Released: 11/25/2011 Document Revised: 04/22/2014 Document Reviewed: 10/10/2013 ExitCare Patient Information 2015 [employer]. This information is not intended to replace advice given to you by your health care provider. Make sure you discuss any questions you have with your health care provider.  

## 2015-01-07 NOTE — Progress Notes (Signed)
Patient here for follow up Complains of bilateral hand and lower leg swelling Patient states this has been going on for about two months Patient also states she is a borderline diabetic so we will run her A1C today

## 2015-01-07 NOTE — Progress Notes (Signed)
MRN: [id number] Name: Regina [name]  Sex: female Age: 74 y.o. DOB: [birth year]  Allergies: Peanuts  Chief Complaint  Patient presents with  . Follow-up    HPI: Patient is 75 y.o. female who went to the emergency room with symptoms of lower extremity swelling, EMR reviewed patient did not had any since symptoms of CHF, her Lasix was increased and potassium was added, patient had also RPR and HIV test done which was negative. As per patient she has not taken  Lasix today, her blood pressure is borderline elevated, as per patient is also following up with her urology since she has been diagnosed with renal mass and is in the process to go for surgery, she does complain of occasional stiffness in her hand joints and does report family history of rheumatoid arthritis, she has chronic lower extremity edema had ultrasound done in the past which was negative for DVT, she never had echocardiogram done denies any orthopnea or PND. She has to of asthma does complain of wheezing and is using albuterol when necessary  Past Medical History  Diagnosis Date  . Asthma   . High cholesterol   . Hypertension   . Chronic kidney disease   . Left renal mass   . Depression   . Arthritis   . Esophageal reflux     History reviewed. No pertinent past surgical history.    Medication List       This list is accurate as of: 01/07/15  1:27 PM.  Always use your most recent med list.               acyclovir 200 MG capsule  Commonly known as:  ZOVIRAX  Take 1 capsule (200 mg total) by mouth daily.     albuterol 108 (90 BASE) MCG/ACT inhaler  Commonly known as:  PROVENTIL HFA;VENTOLIN HFA  Inhale 2 puffs into the lungs every 6 (six) hours as needed. For shortness of breath/wheezing     aspirin EC 81 MG tablet  Take 81 mg by mouth daily.     atorvastatin 40 MG tablet  Commonly known as:  LIPITOR  Take 1 tablet (40 mg total) by mouth daily.     CALCIUM PO  Take by mouth.     cholecalciferol  1000 UNITS tablet  Commonly known as:  VITAMIN D  Take 1,000 Units by mouth daily.     cyclobenzaprine 5 MG tablet  Commonly known as:  FLEXERIL  Take 1 tablet (5 mg total) by mouth daily as needed. For muscle spasms     famotidine 20 MG tablet  Commonly known as:  PEPCID  Take 20 mg by mouth daily.     furosemide 20 MG tablet  Commonly known as:  LASIX  Take 1 tablet (20 mg total) by mouth 2 (two) times daily.     gabapentin 300 MG capsule  Commonly known as:  NEURONTIN  Take 1 capsule (300 mg total) by mouth at bedtime.     potassium chloride SA 20 MEQ tablet  Commonly known as:  K-DUR,KLOR-CON  Take 1 tablet (20 mEq total) by mouth daily.     UNKNOWN TO PATIENT     valsartan 160 MG tablet  Commonly known as:  DIOVAN  Take 1 tablet (160 mg total) by mouth daily.        Meds ordered this encounter  Medications  . acyclovir (ZOVIRAX) 200 MG capsule    Sig: Take 1 capsule (200 mg total) by mouth  daily.    Dispense:  30 capsule    Refill:  3  . atorvastatin (LIPITOR) 40 MG tablet    Sig: Take 1 tablet (40 mg total) by mouth daily.    Dispense:  30 tablet    Refill:  3     There is no immunization history on file for this patient.  Family History  Problem Relation Age of Onset  . Heart disease Maternal Grandmother   . Asthma Maternal Grandfather   . Heart disease Maternal Grandfather   . Cancer Mother   . Diabetes Father     History  Substance Use Topics  . Smoking status: Never Smoker   . Smokeless tobacco: Not on file  . Alcohol Use: No    Review of Systems   As noted in HPI  Filed Vitals:   01/07/15 1236  BP: 159/90  Pulse: 80  Temp: 97.6 F (36.4 C)  Resp: 15    Physical Exam  Physical Exam  Constitutional: No distress.  Eyes: EOM are normal. Pupils are equal, round, and reactive to light.  Cardiovascular: Normal rate and regular rhythm.   Pulmonary/Chest: Breath sounds normal. No respiratory distress. She has no rales.   wheezing    Musculoskeletal:  2+ lower extremity edema   Hand swelling minimal tenderness at MCP joints    CBC    Component Value Date/Time   WBC 5.3 11/26/2014 1425   RBC 4.30 11/26/2014 1425   RBC 4.51 03/01/2014 1143   HGB 11.1* 11/26/2014 1425   HCT 34.6* 11/26/2014 1425   PLT 225 11/26/2014 1425   MCV 80.5 11/26/2014 1425   LYMPHSABS 1.3 11/26/2014 1425   MONOABS 0.3 11/26/2014 1425   EOSABS 0.9* 11/26/2014 1425   BASOSABS 0.0 11/26/2014 1425    CMP     Component Value Date/Time   NA 140 11/26/2014 1425   K 3.9 11/26/2014 1425   CL 102 11/26/2014 1425   CO2 27 11/26/2014 1425   GLUCOSE 89 11/26/2014 1425   BUN 12 11/26/2014 1425   CREATININE 0.65 11/26/2014 1425   CREATININE 0.65 10/31/2014 0957   CALCIUM 9.3 11/26/2014 1425   PROT 7.0 11/26/2014 1425   ALBUMIN 3.6 11/26/2014 1425   AST 28 11/26/2014 1425   ALT 18 11/26/2014 1425   ALKPHOS 108 11/26/2014 1425   BILITOT 0.4 11/26/2014 1425   GFRNONAA 87* 11/26/2014 1425   GFRNONAA >89 10/31/2014 0957   GFRAA >90 11/26/2014 1425   GFRAA >89 10/31/2014 0957    Lab Results  Component Value Date/Time   CHOL 216* 10/31/2014 09:57 AM    No components found for: HGA1C  Lab Results  Component Value Date/Time   AST 28 11/26/2014 02:25 PM    Assessment and Plan  Pre-diabetes - Plan:  Results for orders placed or performed in visit on 01/07/15  HgB A1c  Result Value Ref Range   Hemoglobin A1C 5.90    Patient has prediabetes, advise for low carbohydrate diet.  Essential hypertension, benign - Plan: 2D Echocardiogram without contrast, COMPLETE METABOLIC PANEL WITH GFR  Edema - Plan: had ultrasound done in the past negative for DVT, patient is on  Lasix we'll check  2D Echocardiogram without contrast  Joint pain - Plan: ordered  Rheumatoid factor, ANA, Uric Acid  Cough - Plan:will repeat her DG Chest 2 View  Wheezing/Asthma Albuterol when necessary  History of herpes labialis - Plan: acyclovir (ZOVIRAX) 200  MG capsule  Hyperlipemia - Plan: atorvastatin (LIPITOR) 40 MG tablet  Return in about 3 months (around 04/08/2015) for hypertension.  Lorayne Marek, MD

## 2015-01-09 ENCOUNTER — Ambulatory Visit: Payer: Medicare Other

## 2015-01-09 LAB — ANA: Anti Nuclear Antibody(ANA): NEGATIVE

## 2015-01-13 ENCOUNTER — Telehealth: Payer: Self-pay | Admitting: *Deleted

## 2015-01-13 NOTE — Telephone Encounter (Signed)
-----   Message from Lorayne Marek, MD sent at 01/09/2015 12:57 PM EST ----- Call and let the Patient know that blood work is normal.

## 2015-01-13 NOTE — Telephone Encounter (Signed)
Pt aware of lab results 

## 2015-01-16 ENCOUNTER — Ambulatory Visit (HOSPITAL_COMMUNITY)
Admission: RE | Admit: 2015-01-16 | Discharge: 2015-01-16 | Disposition: A | Discharge status: Home / Self Care | Payer: Medicare Other | Source: Ambulatory Visit | Attending: Internal Medicine | Admitting: Internal Medicine

## 2015-01-16 ENCOUNTER — Ambulatory Visit (HOSPITAL_COMMUNITY)
Admission: RE | Admit: 2015-01-16 | Discharge: 2015-01-16 | Disposition: A | Discharge status: Home / Self Care | Payer: Medicare Other | Source: Ambulatory Visit | Attending: Urology | Admitting: Urology

## 2015-01-16 DIAGNOSIS — R600 Localized edema: Secondary | ICD-10-CM

## 2015-01-16 DIAGNOSIS — R05 Cough: Secondary | ICD-10-CM

## 2015-01-16 DIAGNOSIS — R059 Cough, unspecified: Secondary | ICD-10-CM

## 2015-01-16 DIAGNOSIS — E119 Type 2 diabetes mellitus without complications: Secondary | ICD-10-CM | POA: Diagnosis not present

## 2015-01-16 DIAGNOSIS — R609 Edema, unspecified: Secondary | ICD-10-CM | POA: Diagnosis not present

## 2015-01-16 DIAGNOSIS — I10 Essential (primary) hypertension: Secondary | ICD-10-CM | POA: Diagnosis not present

## 2015-01-16 NOTE — Progress Notes (Signed)
  Echocardiogram 2D Echocardiogram has been performed.  Darlina Sicilian M 01/16/2015, 11:39 AM

## 2015-01-23 ENCOUNTER — Ambulatory Visit
Admission: RE | Admit: 2015-01-23 | Discharge: 2015-01-23 | Disposition: A | Discharge status: Home / Self Care | Payer: Medicare Other | Source: Ambulatory Visit

## 2015-01-23 DIAGNOSIS — Z1231 Encounter for screening mammogram for malignant neoplasm of breast: Secondary | ICD-10-CM

## 2015-01-24 ENCOUNTER — Other Ambulatory Visit: Payer: Self-pay | Admitting: Internal Medicine

## 2015-01-27 ENCOUNTER — Other Ambulatory Visit: Payer: Self-pay | Admitting: Emergency Medicine

## 2015-01-27 MED ORDER — FUROSEMIDE 20 MG PO TABS: 20.0000 mg | ORAL_TABLET | Freq: Two times a day (BID) | ORAL | Status: DC

## 2015-01-28 ENCOUNTER — Telehealth: Payer: Self-pay | Admitting: *Deleted

## 2015-01-28 ENCOUNTER — Other Ambulatory Visit: Payer: Self-pay | Admitting: Internal Medicine

## 2015-01-28 DIAGNOSIS — R928 Other abnormal and inconclusive findings on diagnostic imaging of breast: Secondary | ICD-10-CM

## 2015-01-28 NOTE — Telephone Encounter (Signed)
Pt left message stating that she has kidney cancer and is concerned about her condition. She states that she needs another MRI in March and is requesting Dr. Ihor Dow to have this scheduled.  Per chart review, pt was last seen in this office by Dr. Ihor Dow on 07/26/14 for routine Gyn exam. She does not have a future appt in this office. She had MRI on 09/20/14, followed by Interventional Radiology consult on 10/30/14. The note from Dr. Kathlene Cote at [facility] on 10/30/14 states that pt should have repeat MRI in early April 2016.  I called pt and advised her that our office and physicians would not be following up on her kidney cancer or needs for future MRI exams. She should call her urologist or Primary Care doctor. Pt was confused and stated that Dr. Ihor Dow was the one who sent her to the urologist. I agreed and said that any urology or kidney problem would be handled by the urologist. Pt voiced understanding and said that she will call the urologist.

## 2015-01-29 ENCOUNTER — Emergency Department (HOSPITAL_COMMUNITY): Payer: Medicare Other

## 2015-01-29 ENCOUNTER — Encounter (HOSPITAL_COMMUNITY): Payer: Self-pay

## 2015-01-29 ENCOUNTER — Emergency Department (HOSPITAL_COMMUNITY)
Admission: EM | Admit: 2015-01-29 | Discharge: 2015-01-29 | Disposition: A | Discharge status: Home / Self Care | Payer: Medicare Other | Attending: Emergency Medicine | Admitting: Emergency Medicine

## 2015-01-29 DIAGNOSIS — Z8719 Personal history of other diseases of the digestive system: Secondary | ICD-10-CM | POA: Insufficient documentation

## 2015-01-29 DIAGNOSIS — M25539 Pain in unspecified wrist: Secondary | ICD-10-CM | POA: Insufficient documentation

## 2015-01-29 DIAGNOSIS — M25562 Pain in left knee: Secondary | ICD-10-CM | POA: Insufficient documentation

## 2015-01-29 DIAGNOSIS — M25532 Pain in left wrist: Secondary | ICD-10-CM | POA: Diagnosis not present

## 2015-01-29 DIAGNOSIS — I129 Hypertensive chronic kidney disease with stage 1 through stage 4 chronic kidney disease, or unspecified chronic kidney disease: Secondary | ICD-10-CM | POA: Diagnosis not present

## 2015-01-29 DIAGNOSIS — M1990 Unspecified osteoarthritis, unspecified site: Secondary | ICD-10-CM | POA: Insufficient documentation

## 2015-01-29 DIAGNOSIS — Z8659 Personal history of other mental and behavioral disorders: Secondary | ICD-10-CM | POA: Insufficient documentation

## 2015-01-29 DIAGNOSIS — N189 Chronic kidney disease, unspecified: Secondary | ICD-10-CM | POA: Diagnosis not present

## 2015-01-29 DIAGNOSIS — M25531 Pain in right wrist: Secondary | ICD-10-CM | POA: Diagnosis not present

## 2015-01-29 DIAGNOSIS — M25569 Pain in unspecified knee: Secondary | ICD-10-CM

## 2015-01-29 DIAGNOSIS — G8929 Other chronic pain: Secondary | ICD-10-CM | POA: Diagnosis not present

## 2015-01-29 DIAGNOSIS — J45901 Unspecified asthma with (acute) exacerbation: Secondary | ICD-10-CM | POA: Diagnosis not present

## 2015-01-29 DIAGNOSIS — Z7982 Long term (current) use of aspirin: Secondary | ICD-10-CM | POA: Diagnosis not present

## 2015-01-29 DIAGNOSIS — Z79899 Other long term (current) drug therapy: Secondary | ICD-10-CM | POA: Insufficient documentation

## 2015-01-29 MED ORDER — HYDROCODONE-ACETAMINOPHEN 5-325 MG PO TABS
1.0000 | ORAL_TABLET | Freq: Once | ORAL | Status: AC
  Administered 2015-01-29: 1 via ORAL
  Filled 2015-01-29: qty 1

## 2015-01-29 MED ORDER — ALBUTEROL SULFATE HFA 108 (90 BASE) MCG/ACT IN AERS
2.0000 | INHALATION_SPRAY | RESPIRATORY_TRACT | Status: DC | PRN
  Administered 2015-01-29: 2 via RESPIRATORY_TRACT
  Filled 2015-01-29: qty 6.7

## 2015-01-29 MED ORDER — ALBUTEROL SULFATE (2.5 MG/3ML) 0.083% IN NEBU
5.0000 mg | INHALATION_SOLUTION | Freq: Once | RESPIRATORY_TRACT | Status: AC
  Administered 2015-01-29: 5 mg via RESPIRATORY_TRACT
  Filled 2015-01-29: qty 6

## 2015-01-29 MED ORDER — IPRATROPIUM BROMIDE 0.02 % IN SOLN
0.5000 mg | Freq: Once | RESPIRATORY_TRACT | Status: AC
  Administered 2015-01-29: 0.5 mg via RESPIRATORY_TRACT
  Filled 2015-01-29: qty 2.5

## 2015-01-29 MED ORDER — HYDROCODONE-ACETAMINOPHEN 5-325 MG PO TABS: 1.0000 | ORAL_TABLET | Freq: Four times a day (QID) | ORAL | Status: AC | PRN

## 2015-01-29 NOTE — ED Notes (Signed)
Pt from home with c/o left knee pain and leg pain after a fall a month ago.  Pt also reporting bilateral wrist pain.  Pt with hx of arthritis. Pain 5/10.  Sts it's hard to lift leg after the end of the day.

## 2015-01-29 NOTE — ED Provider Notes (Signed)
CSN: [id number]     Arrival date & time 01/29/15  1215 History   First MD Initiated Contact with Patient 01/29/15 1415     Chief Complaint  Patient presents with  . Leg Pain  . Wrist Pain     (Consider location/radiation/quality/duration/timing/severity/associated sxs/prior Treatment) HPI Comments: Patient with past medical history of asthma presents to the emergency department with chief complaint of left knee pain.  States pain started about 3 weeks ago after a mechanical fall. She states that it frequently gives out on her now. She states pain is moderate. She has not tried taking anything to alleviate her symptoms. It is aggravated with movement.  The history is provided by the patient. No language interpreter was used.    Past Medical History  Diagnosis Date  . Asthma   . High cholesterol   . Hypertension   . Chronic kidney disease   . Left renal mass   . Depression   . Arthritis   . Esophageal reflux    History reviewed. No pertinent past surgical history. Family History  Problem Relation Age of Onset  . Heart disease Maternal Grandmother   . Asthma Maternal Grandfather   . Heart disease Maternal Grandfather   . Cancer Mother   . Diabetes Father    History  Substance Use Topics  . Smoking status: Never Smoker   . Smokeless tobacco: Not on file  . Alcohol Use: No   OB History    Gravida Para Term Preterm AB TAB SAB Ectopic Multiple Living   7 6 6  1 1    6      Review of Systems  Constitutional: Negative for fever and chills.  Respiratory: Negative for shortness of breath and wheezing.   Cardiovascular: Negative for chest pain.  Gastrointestinal: Negative for nausea, vomiting, diarrhea and constipation.  Genitourinary: Negative for dysuria.  Musculoskeletal: Positive for arthralgias.  All other systems reviewed and are negative.     Allergies  Peanuts  Home Medications   Prior to Admission medications   Medication Sig Start Date End Date Taking?  Authorizing Provider  acyclovir (ZOVIRAX) 200 MG capsule Take 1 capsule (200 mg total) by mouth daily. 01/07/15   Lorayne Marek, MD  albuterol (PROVENTIL HFA;VENTOLIN HFA) 108 (90 BASE) MCG/ACT inhaler Inhale 2 puffs into the lungs every 6 (six) hours as needed. For shortness of breath/wheezing 10/31/14   Lorayne Marek, MD  aspirin EC 81 MG tablet Take 81 mg by mouth daily.    Historical Provider, MD  atorvastatin (LIPITOR) 40 MG tablet Take 1 tablet (40 mg total) by mouth daily. 01/07/15   Lorayne Marek, MD  CALCIUM PO Take by mouth.    Historical Provider, MD  cholecalciferol (VITAMIN D) 1000 UNITS tablet Take 1,000 Units by mouth daily.    Historical Provider, MD  cyclobenzaprine (FLEXERIL) 5 MG tablet Take 1 tablet (5 mg total) by mouth daily as needed. For muscle spasms 06/06/14   Lorayne Marek, MD  famotidine (PEPCID) 20 MG tablet Take 20 mg by mouth daily.     Historical Provider, MD  furosemide (LASIX) 20 MG tablet Take 1 tablet (20 mg total) by mouth 2 (two) times daily. 01/27/15   Lorayne Marek, MD  gabapentin (NEURONTIN) 300 MG capsule Take 1 capsule (300 mg total) by mouth at bedtime. Patient not taking: Reported on 11/26/2014 08/02/14   Harriet Masson, DPM  UNKNOWN TO PATIENT     Historical Provider, MD  valsartan (DIOVAN) 160 MG tablet Take 1 tablet (  160 mg total) by mouth daily. 04/12/14   Deepak Advani, MD   BP 110/58 mmHg  Pulse 83  Temp(Src) 98 F (36.7 C) (Oral)  Resp 16  SpO2 100% Physical Exam  Constitutional: She is oriented to person, place, and time. She appears well-developed and well-nourished.  HENT:  Head: Normocephalic and atraumatic.  Eyes: Conjunctivae and EOM are normal. Pupils are equal, round, and reactive to light.  Neck: Normal range of motion. Neck supple.  Cardiovascular: Normal rate and regular rhythm.  Exam reveals no gallop and no friction rub.   No murmur heard. Pulmonary/Chest: Effort normal. No respiratory distress. She has wheezes. She has no rales.  She exhibits no tenderness.  Scattered wheezes, no increased work of breathing  Abdominal: Soft. Bowel sounds are normal. She exhibits no distension and no mass. There is no tenderness. There is no rebound and no guarding.  Musculoskeletal: Normal range of motion. She exhibits no edema or tenderness.  Left knee mildly tender to palpation diffusely, no range of motion or strength deficits, no bony abnormality or deformity  Wrist range of motion strength 5/5 bilaterally  Neurological: She is alert and oriented to person, place, and time.  Skin: Skin is warm and dry.  Mild skin abrasion to left antero-medial shin  Psychiatric: She has a normal mood and affect. Her behavior is normal. Judgment and thought content normal.  Nursing note and vitals reviewed.   ED Course  Procedures (including critical care time) Labs Review Labs Reviewed - No data to display  Imaging Review Dg Knee Complete 4 Views Left  01/29/2015   CLINICAL DATA:  Fall 1 month ago with left knee pain. Initial encounter.  EXAM: LEFT KNEE - COMPLETE 4+ VIEW  COMPARISON:  None.  FINDINGS: There is no evidence of fracture, dislocation, or joint effusion. There is no evidence of arthropathy or other focal bone abnormality. Soft tissues are unremarkable.  IMPRESSION: Negative.   Electronically Signed   By: Monte Fantasia M.D.   On: 01/29/2015 15:49     EKG Interpretation None      MDM   Final diagnoses:  Knee pain, acute  Wrist pain, chronic, unspecified laterality    Patient with left knee pain following a mechanical fall 3 weeks ago. Will check imaging, but a more suspicious of ligamentous versus meniscal injury. Anticipate discharge with a knee sleeve and some pain medicine. Recommend orthopedic follow-up.  No bony abnormality or deformity about the wrists, consider carpal tunnel versus arthritis. No evidence of infection.  Patient had some wheezing on exam, given breathing treatment with improvement. Will give  inhaler and discharge to home. Patient discussed with Dr. Betsey Holiday.    Montine Circle, PA-C 01/29/15 [place], MD 01/29/15 [phone]

## 2015-01-29 NOTE — Discharge Instructions (Signed)

## 2015-02-04 ENCOUNTER — Telehealth: Payer: Self-pay | Admitting: Emergency Medicine

## 2015-02-07 ENCOUNTER — Ambulatory Visit: Payer: Medicare Other | Attending: Internal Medicine | Admitting: Internal Medicine

## 2015-02-07 ENCOUNTER — Other Ambulatory Visit: Payer: Medicare Other

## 2015-02-07 ENCOUNTER — Encounter: Payer: Self-pay | Admitting: Internal Medicine

## 2015-02-07 VITALS — BP 168/79 | HR 83 | Temp 98.0°F | Resp 15 | Wt 156.0 lb

## 2015-02-07 DIAGNOSIS — F329 Major depressive disorder, single episode, unspecified: Secondary | ICD-10-CM | POA: Diagnosis not present

## 2015-02-07 DIAGNOSIS — I10 Essential (primary) hypertension: Secondary | ICD-10-CM | POA: Diagnosis not present

## 2015-02-07 DIAGNOSIS — K219 Gastro-esophageal reflux disease without esophagitis: Secondary | ICD-10-CM | POA: Diagnosis not present

## 2015-02-07 DIAGNOSIS — R609 Edema, unspecified: Secondary | ICD-10-CM | POA: Diagnosis not present

## 2015-02-07 DIAGNOSIS — Z79899 Other long term (current) drug therapy: Secondary | ICD-10-CM | POA: Diagnosis not present

## 2015-02-07 DIAGNOSIS — Z7982 Long term (current) use of aspirin: Secondary | ICD-10-CM | POA: Insufficient documentation

## 2015-02-07 DIAGNOSIS — M25562 Pain in left knee: Secondary | ICD-10-CM | POA: Diagnosis not present

## 2015-02-07 DIAGNOSIS — R21 Rash and other nonspecific skin eruption: Secondary | ICD-10-CM

## 2015-02-07 DIAGNOSIS — N189 Chronic kidney disease, unspecified: Secondary | ICD-10-CM | POA: Insufficient documentation

## 2015-02-07 DIAGNOSIS — I129 Hypertensive chronic kidney disease with stage 1 through stage 4 chronic kidney disease, or unspecified chronic kidney disease: Secondary | ICD-10-CM | POA: Diagnosis not present

## 2015-02-07 LAB — COMPLETE METABOLIC PANEL WITH GFR
ALT: 19 U/L (ref 0–35)
AST: 27 U/L (ref 0–37)
Albumin: 4.3 g/dL (ref 3.5–5.2)
Alkaline Phosphatase: 103 U/L (ref 39–117)
BUN: 8 mg/dL (ref 6–23)
CHLORIDE: 102 meq/L (ref 96–112)
CO2: 27 mEq/L (ref 19–32)
Calcium: 9.7 mg/dL (ref 8.4–10.5)
Creat: 0.61 mg/dL (ref 0.50–1.10)
GFR, Est African American: >89 mL/min
Glucose, Bld: 97 mg/dL (ref 70–99)
Potassium: 4.4 mEq/L (ref 3.5–5.3)
Sodium: 138 mEq/L (ref 135–145)
Total Bilirubin: 0.5 mg/dL (ref 0.2–1.2)
Total Protein: 7 g/dL (ref 6.0–8.3)

## 2015-02-07 MED ORDER — POTASSIUM CHLORIDE CRYS ER 20 MEQ PO TBCR: 20.0000 meq | EXTENDED_RELEASE_TABLET | Freq: Every day | ORAL | Status: AC

## 2015-02-07 MED ORDER — HYDROCORTISONE 2.5 % EX CREA: TOPICAL_CREAM | Freq: Two times a day (BID) | CUTANEOUS | Status: DC

## 2015-02-07 MED ORDER — FUROSEMIDE 20 MG PO TABS: 20.0000 mg | ORAL_TABLET | Freq: Two times a day (BID) | ORAL | Status: AC

## 2015-02-07 MED ORDER — LOSARTAN POTASSIUM 100 MG PO TABS: 100.0000 mg | ORAL_TABLET | Freq: Every day | ORAL | Status: DC

## 2015-02-07 NOTE — Progress Notes (Signed)
Patient here for follow up Patient states she is here for a physical Requesting refills on her medications

## 2015-02-07 NOTE — Patient Instructions (Signed)
DASH Eating Plan °DASH stands for "Dietary Approaches to Stop Hypertension." The DASH eating plan is a healthy eating plan that has been shown to reduce high blood pressure (hypertension). Additional health benefits may include reducing the risk of type 2 diabetes mellitus, heart disease, and stroke. The DASH eating plan may also help with weight loss. °WHAT DO I NEED TO KNOW ABOUT THE DASH EATING PLAN? °For the DASH eating plan, you will follow these general guidelines: °· Choose foods with a percent daily value for sodium of less than 5% (as listed on the food label). °· Use salt-free seasonings or herbs instead of table salt or sea salt. °· Check with your health care provider or pharmacist before using salt substitutes. °· Eat lower-sodium products, often labeled as "lower sodium" or "no salt added." °· Eat fresh foods. °· Eat more vegetables, fruits, and low-fat dairy products. °· Choose whole grains. Look for the word "whole" as the first word in the ingredient list. °· Choose fish and skinless chicken or turkey more often than red meat. Limit fish, poultry, and meat to 6 oz (170 g) each day. °· Limit sweets, desserts, sugars, and sugary drinks. °· Choose heart-healthy fats. °· Limit cheese to 1 oz (28 g) per day. °· Eat more home-cooked food and less restaurant, buffet, and fast food. °· Limit fried foods. °· Cook foods using methods other than frying. °· Limit canned vegetables. If you do use them, rinse them well to decrease the sodium. °· When eating at a restaurant, ask that your food be prepared with less salt, or no salt if possible. °WHAT FOODS CAN I EAT? °Seek help from a dietitian for individual calorie needs. °Grains °Whole grain or whole wheat bread. Brown rice. Whole grain or whole wheat pasta. Quinoa, bulgur, and whole grain cereals. Low-sodium cereals. Corn or whole wheat flour tortillas. Whole grain cornbread. Whole grain crackers. Low-sodium crackers. °Vegetables °Fresh or frozen vegetables  (raw, steamed, roasted, or grilled). Low-sodium or reduced-sodium tomato and vegetable juices. Low-sodium or reduced-sodium tomato sauce and paste. Low-sodium or reduced-sodium canned vegetables.  °Fruits °All fresh, canned (in natural juice), or frozen fruits. °Meat and Other Protein Products °Ground beef (85% or leaner), grass-fed beef, or beef trimmed of fat. Skinless chicken or turkey. Ground chicken or turkey. Pork trimmed of fat. All fish and seafood. Eggs. Dried beans, peas, or lentils. Unsalted nuts and seeds. Unsalted canned beans. °Dairy °Low-fat dairy products, such as skim or 1% milk, 2% or reduced-fat cheeses, low-fat ricotta or cottage cheese, or plain low-fat yogurt. Low-sodium or reduced-sodium cheeses. °Fats and Oils °Tub margarines without trans fats. Light or reduced-fat mayonnaise and salad dressings (reduced sodium). Avocado. Safflower, olive, or canola oils. Natural peanut or almond butter. °Other °Unsalted popcorn and pretzels. °The items listed above may not be a complete list of recommended foods or beverages. Contact your dietitian for more options. °WHAT FOODS ARE NOT RECOMMENDED? °Grains °White bread. White pasta. White rice. Refined cornbread. Bagels and croissants. Crackers that contain trans fat. °Vegetables °Creamed or fried vegetables. Vegetables in a cheese sauce. Regular canned vegetables. Regular canned tomato sauce and paste. Regular tomato and vegetable juices. °Fruits °Dried fruits. Canned fruit in light or heavy syrup. Fruit juice. °Meat and Other Protein Products °Fatty cuts of meat. Ribs, chicken wings, bacon, sausage, bologna, salami, chitterlings, fatback, hot dogs, bratwurst, and packaged luncheon meats. Salted nuts and seeds. Canned beans with salt. °Dairy °Whole or 2% milk, cream, half-and-half, and cream cheese. Whole-fat or sweetened yogurt. Full-fat   cheeses or blue cheese. Nondairy creamers and whipped toppings. Processed cheese, cheese spreads, or cheese  curds. °Condiments °Onion and garlic salt, seasoned salt, table salt, and sea salt. Canned and packaged gravies. Worcestershire sauce. Tartar sauce. Barbecue sauce. Teriyaki sauce. Soy sauce, including reduced sodium. Steak sauce. Fish sauce. Oyster sauce. Cocktail sauce. Horseradish. Ketchup and mustard. Meat flavorings and tenderizers. Bouillon cubes. Hot sauce. Tabasco sauce. Marinades. Taco seasonings. Relishes. °Fats and Oils °Butter, stick margarine, lard, shortening, ghee, and bacon fat. Coconut, palm kernel, or palm oils. Regular salad dressings. °Other °Pickles and olives. Salted popcorn and pretzels. °The items listed above may not be a complete list of foods and beverages to avoid. Contact your dietitian for more information. °WHERE CAN I FIND MORE INFORMATION? °National [employer], [employer], [employer]: www.nhlbi.nih.gov/health/health-topics/topics/dash/ °Document Released: 11/25/2011 Document Revised: 04/22/2014 Document Reviewed: 10/10/2013 °ExitCare® Patient Information ©2015 [employer]. This information is not intended to replace advice given to you by your health care provider. Make sure you discuss any questions you have with your health care provider. ° °

## 2015-02-07 NOTE — Progress Notes (Signed)
MRN: [id number] Name: Regina [name]  Sex: female Age: 74 y.o. DOB: [birth year]  Allergies: Peanuts  Chief Complaint  Patient presents with  . Follow-up    HPI: Patient is 74 y.o. female who recently went to the emergency room with a chief complaint of left knee pain after she had fell down 3 weeks prior to that, EMR reviewed patient had an x-ray done which was negative for any bony abnormality but was thought to be probably ligamentous versus meniscal injury patient was given pain medication and was advised to schedule appointment with orthopedics, today she is requesting referral, she also has a itchy rash on her lower extremities, she has tried over-the-counter cream without much improvement. Patient also history of hypertension, today her blood pressure is elevated denies any headache dizziness chest pain or shortness of breath, she used to be on Diovan which is apparently not being covered by her insurance will switch to losartan  Past Medical History  Diagnosis Date  . Asthma   . High cholesterol   . Hypertension   . Chronic kidney disease   . Left renal mass   . Depression   . Arthritis   . Esophageal reflux     History reviewed. No pertinent past surgical history.    Medication List       This list is accurate as of: 02/07/15 11:05 AM.  Always use your most recent med list.               acyclovir 200 MG capsule  Commonly known as:  ZOVIRAX  Take 1 capsule (200 mg total) by mouth daily.     albuterol 108 (90 BASE) MCG/ACT inhaler  Commonly known as:  PROVENTIL HFA;VENTOLIN HFA  Inhale 2 puffs into the lungs every 6 (six) hours as needed. For shortness of breath/wheezing     aspirin EC 81 MG tablet  Take 81 mg by mouth daily.     atorvastatin 40 MG tablet  Commonly known as:  LIPITOR  Take 1 tablet (40 mg total) by mouth daily.     CALCIUM PO  Take by mouth.     cholecalciferol 1000 UNITS tablet  Commonly known as:  VITAMIN D  Take 1,000 Units by  mouth daily.     cyclobenzaprine 5 MG tablet  Commonly known as:  FLEXERIL  Take 1 tablet (5 mg total) by mouth daily as needed. For muscle spasms     famotidine 20 MG tablet  Commonly known as:  PEPCID  Take 20 mg by mouth daily.     furosemide 20 MG tablet  Commonly known as:  LASIX  Take 1 tablet (20 mg total) by mouth 2 (two) times daily.     gabapentin 300 MG capsule  Commonly known as:  NEURONTIN  Take 1 capsule (300 mg total) by mouth at bedtime.     HYDROcodone-acetaminophen 5-325 MG per tablet  Commonly known as:  NORCO/VICODIN  Take 1 tablet by mouth every 6 (six) hours as needed.     hydrocortisone 2.5 % cream  Apply topically 2 (two) times daily.     losartan 100 MG tablet  Commonly known as:  COZAAR  Take 1 tablet (100 mg total) by mouth daily.     potassium chloride SA 20 MEQ tablet  Commonly known as:  K-DUR,KLOR-CON  Take 1 tablet (20 mEq total) by mouth daily.     UNKNOWN TO PATIENT     valsartan 160 MG tablet  Commonly known  as:  DIOVAN  Take 1 tablet (160 mg total) by mouth daily.        Meds ordered this encounter  Medications  . DISCONTD: potassium chloride SA (K-DUR,KLOR-CON) 20 MEQ tablet    Sig: Take 20 mEq by mouth daily.  . furosemide (LASIX) 20 MG tablet    Sig: Take 1 tablet (20 mg total) by mouth 2 (two) times daily.    Dispense:  60 tablet    Refill:  0  . potassium chloride SA (K-DUR,KLOR-CON) 20 MEQ tablet    Sig: Take 1 tablet (20 mEq total) by mouth daily.    Dispense:  30 tablet    Refill:  2  . losartan (COZAAR) 100 MG tablet    Sig: Take 1 tablet (100 mg total) by mouth daily.    Dispense:  30 tablet    Refill:  3  . hydrocortisone 2.5 % cream    Sig: Apply topically 2 (two) times daily.    Dispense:  30 g    Refill:  0     There is no immunization history on file for this patient.  Family History  Problem Relation Age of Onset  . Heart disease Maternal Grandmother   . Asthma Maternal Grandfather   . Heart  disease Maternal Grandfather   . Cancer Mother   . Diabetes Father     History  Substance Use Topics  . Smoking status: Never Smoker   . Smokeless tobacco: Not on file  . Alcohol Use: No    Review of Systems   As noted in HPI  Filed Vitals:   02/07/15 1011  BP: 168/79  Pulse: 83  Temp: 98 F (36.7 C)  Resp: 15    Physical Exam  Physical Exam  Eyes: EOM are normal. Pupils are equal, round, and reactive to light.  Cardiovascular: Normal rate and regular rhythm.   Pulmonary/Chest: Breath sounds normal. No respiratory distress. She has no wheezes. She has no rales.  Musculoskeletal:  1+ lower extremity edema L>R   Rash/scab formation on left left no tenderness or warmth or signs of infection      CBC    Component Value Date/Time   WBC 5.3 11/26/2014 1425   RBC 4.30 11/26/2014 1425   RBC 4.51 03/01/2014 1143   HGB 11.1* 11/26/2014 1425   HCT 34.6* 11/26/2014 1425   PLT 225 11/26/2014 1425   MCV 80.5 11/26/2014 1425   LYMPHSABS 1.3 11/26/2014 1425   MONOABS 0.3 11/26/2014 1425   EOSABS 0.9* 11/26/2014 1425   BASOSABS 0.0 11/26/2014 1425    CMP     Component Value Date/Time   NA 138 01/07/2015 1326   K 4.0 01/07/2015 1326   CL 100 01/07/2015 1326   CO2 27 01/07/2015 1326   GLUCOSE 91 01/07/2015 1326   BUN 13 01/07/2015 1326   CREATININE 0.72 01/07/2015 1326   CREATININE 0.65 11/26/2014 1425   CALCIUM 9.3 01/07/2015 1326   PROT 6.9 01/07/2015 1326   ALBUMIN 3.9 01/07/2015 1326   AST 24 01/07/2015 1326   ALT 20 01/07/2015 1326   ALKPHOS 97 01/07/2015 1326   BILITOT 0.6 01/07/2015 1326   GFRNONAA 85 01/07/2015 1326   GFRNONAA 87* 11/26/2014 1425   GFRAA >89 01/07/2015 1326   GFRAA >90 11/26/2014 1425    Lab Results  Component Value Date/Time   CHOL 216* 10/31/2014 09:57 AM    No components found for: HGA1C  Lab Results  Component Value Date/Time   AST 24  01/07/2015 01:26 PM    Assessment and Plan  Essential hypertension, benign -  Plan: patient will continue with Lasix, she used to be on Diovan, switched to losartan (COZAAR) 100 MG tablet, also advise patient for DASH diet, will repeat blood chemistry today.  Edema - Plan:currently on furosemide (LASIX) 20 MG tablet, will check potassium level.  Left knee pain - Plan: Ambulatory referral to Orthopedic Surgery  Rash and nonspecific skin eruption - Plan: Ambulatory referral to Dermatology, hydrocortisone 2.5 % cream   Return in about 3 months (around 05/08/2015) for hypertension.   This note has been created with Surveyor, quantity. Any transcriptional errors are unintentional.    Lorayne Marek, MD

## 2015-02-12 ENCOUNTER — Telehealth: Payer: Self-pay

## 2015-02-12 NOTE — Telephone Encounter (Signed)
-----   Message from Lorayne Marek, MD sent at 02/10/2015  9:23 AM EST ----- Call and let the Patient know that blood work is normal.

## 2015-02-12 NOTE — Telephone Encounter (Signed)
Patient not available Left message on voice mail that all labs were normal

## 2015-02-13 ENCOUNTER — Encounter: Payer: Self-pay | Admitting: Sports Medicine

## 2015-02-13 ENCOUNTER — Ambulatory Visit (INDEPENDENT_AMBULATORY_CARE_PROVIDER_SITE_OTHER): Payer: Medicare Other | Admitting: Sports Medicine

## 2015-02-13 VITALS — BP 150/72 | HR 89 | Ht 62.0 in | Wt 156.0 lb

## 2015-02-13 DIAGNOSIS — G629 Polyneuropathy, unspecified: Secondary | ICD-10-CM | POA: Diagnosis not present

## 2015-02-13 DIAGNOSIS — I872 Venous insufficiency (chronic) (peripheral): Secondary | ICD-10-CM | POA: Insufficient documentation

## 2015-02-13 MED ORDER — AMITRIPTYLINE HCL 25 MG PO TABS: 25.0000 mg | ORAL_TABLET | Freq: Every day | ORAL | Status: DC

## 2015-02-13 NOTE — Progress Notes (Signed)
Regina [name] - 74 y.o. female MRN [id number]  Date of birth: 1941/04/27  SUBJECTIVE:  Including CC & ROS.  Referral question from: Lorayne Marek, MD Reason for referral: Bilateral knee and hand pain  Patient is a 74 year old African-American female presented today with bilateral hand and lower extremity numbness and tingling as well as stiffness.  Patient reports that she's had a numbness and tingling sensation in her hands and lower legs for several months. She's been seen by a podiatrist who is treating her peripheral neuropathy and venous insufficiency. Patient was previously on gabapentin but this was discontinued and not effective for her neuropathic type pain.  Concerning patient's knee pain she reports that she fell last year on her knee and will occasionally get pain but that is not her main concern is her pain is intermittent and not constant in nature. Recent x-rays her PCP office were not significant for any severe osteo-arthritic changes however these are not standing x-rays.  Additional treatment patient's been taking for her neuropathic type pain include muscle relaxer which has not been effective.  ROS:  Patient also complains small sores that are dark pigmented on her hands and feet and lower legs. These are non-itchy not erythematous. Review of systems otherwise negative except for information present in HPI  HISTORY: Past Medical, Surgical, Social, and Family History Reviewed & Updated per EMR. Pertinent Historical Findings include: Past medical history significant for: Hypertension, asthma, peripheral neuropathy, venous insufficiency, hyperlipidemia, vitamin D deficiency  family history positive for diabetes in her father and heart disease grandparents Patient's currently not sexually active, denies any tobacco, drug or alcohol use.  DATA REVIEWED: Personally reviewed patient's recent 4 view knee x-rays from 01/29/15 that showed no obvious fractures, knee effusions, mild  degenerative changes. These are not standing knee views and unable to completely evaluate degree of arthritis.  PHYSICAL EXAM:  VS: BP:(!) 150/72 mmHg  HR:89bpm  TEMP: ( )  RESP:   HT:_0  (157.5 cm)   WT:156 lb (70.761 kg)  BMI:28.6 LEFT KNEE EXAM:  General: well nourished, no acute distress Skin of LE: warm; dry, no rashes, lesions, ecchymosis or erythema. Vascular: Dorsal pedal pulses 2+ bilaterally Neurologically: Sensation to light touch lower extremities equal and intact  Normal to inspection with no erythema or effusion or obvious bony abnormalities. Palpation normal with no warmth or joint line tenderness or patellar tenderness or condyle tenderness. ROM normal in flexion and extension and lower leg rotation. Range of motion:  ROM normal in flexion and extension and lower leg rotation. Ligaments with solid consistent endpoints including ACL, PCL, LCL, MCL. Negative patella apprehension and normal tracking Meniscal evaluation: Negative McMurray's test, Negative thessaly's test, Normal gait. Hamstring and quadriceps strength is normal.  FEET EXAM:  General: well nourished Skin of LE: warm; dry, no rashes,ecchymosis or erythema. Patient has signs of venous insufficiency with small ulcerations to the skin no signs of erythema or drainage or cellulitis. Vascular: Dorsal pedal pulses 2+ bilaterally Neurologically: Sensation to light touch lower extremities equal and intact bilaterally.  Observation - no ecchymosis, no edema, or hematoma present  Palpation: no TTP Normal ankle motion and strength bilaterally  Extension/flexion 5/5 strength bilaterally in toes Weight-bearing foot exam:  Longitudinal arch: mild pes plantus Heel position: neutral  ASSESSMENT & PLAN: See problem based charting & AVS for pt instructions. Patient is a 74 year old nondiabetic presented today with bilateral hand and lower leg neuropathic type pain with no known etiology  Impression: Peripheral  neuropathy unknown etiology  Peripheral venous insufficiency  Recommendations:  -Recommended further testing by PCPs office for causes of patient's peripheral neuropathy. Recommended labs are listed below -Patient's gabapentin was discontinued and started her on 25 mg amitriptyline at that time for her neuropathic pain -She is not complaining of her knee pain today and her x-rays do not show significant arthritis so no further management was taken. -Patient will follow-up with her PCP concerning her peripheral neuropathy type pain any further assistance is needed by our office based on hesitate to refer the patient back.  Recommended peripheral neuropathy testing: A1c - negative for DM Serum protein electrophoresis Vitamin B12 ANA - negative ESR RPR Heavy metals Anti-CCP for RA  Anti-Ro anti-la antibodies for Sjogren's syndrome Hepatitis panel and HIV

## 2015-02-14 ENCOUNTER — Other Ambulatory Visit: Payer: Self-pay | Admitting: Internal Medicine

## 2015-02-14 ENCOUNTER — Ambulatory Visit
Admission: RE | Admit: 2015-02-14 | Discharge: 2015-02-14 | Disposition: A | Discharge status: Home / Self Care | Payer: Medicare Other | Source: Ambulatory Visit | Attending: Internal Medicine | Admitting: Internal Medicine

## 2015-02-14 DIAGNOSIS — R928 Other abnormal and inconclusive findings on diagnostic imaging of breast: Secondary | ICD-10-CM

## 2015-02-14 DIAGNOSIS — N6310 Unspecified lump in the right breast, unspecified quadrant: Secondary | ICD-10-CM

## 2015-02-14 DIAGNOSIS — N63 Unspecified lump in breast: Secondary | ICD-10-CM | POA: Diagnosis not present

## 2015-02-20 DIAGNOSIS — L309 Dermatitis, unspecified: Secondary | ICD-10-CM | POA: Diagnosis not present

## 2015-02-21 ENCOUNTER — Telehealth: Payer: Self-pay | Admitting: Internal Medicine

## 2015-02-21 ENCOUNTER — Telehealth: Payer: Self-pay | Admitting: Emergency Medicine

## 2015-02-21 NOTE — Telephone Encounter (Signed)
Patient called to request for her MRI to be rescheduled. Patient states that the appointment was cancelled. Please f/u with pt.

## 2015-02-21 NOTE — Telephone Encounter (Signed)
Pt instructed.

## 2015-02-24 ENCOUNTER — Other Ambulatory Visit (HOSPITAL_COMMUNITY): Payer: Self-pay | Admitting: Interventional Radiology

## 2015-02-24 ENCOUNTER — Other Ambulatory Visit: Payer: Self-pay | Admitting: Radiology

## 2015-02-24 DIAGNOSIS — N2889 Other specified disorders of kidney and ureter: Secondary | ICD-10-CM

## 2015-02-25 ENCOUNTER — Ambulatory Visit
Admission: RE | Admit: 2015-02-25 | Discharge: 2015-02-25 | Disposition: A | Discharge status: Home / Self Care | Payer: Medicare Other | Source: Ambulatory Visit | Attending: Internal Medicine | Admitting: Internal Medicine

## 2015-02-25 ENCOUNTER — Other Ambulatory Visit: Payer: Self-pay | Admitting: Internal Medicine

## 2015-02-25 DIAGNOSIS — N6310 Unspecified lump in the right breast, unspecified quadrant: Secondary | ICD-10-CM

## 2015-02-25 DIAGNOSIS — I899 Noninfective disorder of lymphatic vessels and lymph nodes, unspecified: Secondary | ICD-10-CM | POA: Diagnosis not present

## 2015-02-25 DIAGNOSIS — R599 Enlarged lymph nodes, unspecified: Secondary | ICD-10-CM | POA: Diagnosis not present

## 2015-02-28 DIAGNOSIS — Z961 Presence of intraocular lens: Secondary | ICD-10-CM | POA: Diagnosis not present

## 2015-02-28 DIAGNOSIS — H04123 Dry eye syndrome of bilateral lacrimal glands: Secondary | ICD-10-CM | POA: Diagnosis not present

## 2015-02-28 DIAGNOSIS — E119 Type 2 diabetes mellitus without complications: Secondary | ICD-10-CM | POA: Diagnosis not present

## 2015-02-28 DIAGNOSIS — H26491 Other secondary cataract, right eye: Secondary | ICD-10-CM | POA: Diagnosis not present

## 2015-02-28 DIAGNOSIS — H40013 Open angle with borderline findings, low risk, bilateral: Secondary | ICD-10-CM | POA: Diagnosis not present

## 2015-03-05 DIAGNOSIS — Z961 Presence of intraocular lens: Secondary | ICD-10-CM | POA: Diagnosis not present

## 2015-03-05 DIAGNOSIS — H26491 Other secondary cataract, right eye: Secondary | ICD-10-CM | POA: Diagnosis not present

## 2015-03-07 DIAGNOSIS — N2889 Other specified disorders of kidney and ureter: Secondary | ICD-10-CM | POA: Diagnosis not present

## 2015-03-07 DIAGNOSIS — N289 Disorder of kidney and ureter, unspecified: Secondary | ICD-10-CM | POA: Diagnosis not present

## 2015-03-08 LAB — CREATININE WITH EST GFR
CREATININE: 0.67 mg/dL (ref 0.50–1.10)
GFR, Est African American: >89 mL/min
GFR, Est Non African American: 89 mL/min

## 2015-03-08 LAB — BUN: BUN: 7 mg/dL (ref 6–23)

## 2015-03-25 ENCOUNTER — Ambulatory Visit (HOSPITAL_COMMUNITY)
Admission: RE | Admit: 2015-03-25 | Discharge: 2015-03-25 | Disposition: A | Discharge status: Home / Self Care | Payer: Medicare Other | Source: Ambulatory Visit | Attending: Interventional Radiology | Admitting: Interventional Radiology

## 2015-03-25 DIAGNOSIS — R312 Other microscopic hematuria: Secondary | ICD-10-CM | POA: Diagnosis not present

## 2015-03-25 DIAGNOSIS — N2889 Other specified disorders of kidney and ureter: Secondary | ICD-10-CM | POA: Insufficient documentation

## 2015-03-25 DIAGNOSIS — K868 Other specified diseases of pancreas: Secondary | ICD-10-CM | POA: Diagnosis not present

## 2015-03-25 MED ORDER — GADOBENATE DIMEGLUMINE 529 MG/ML IV SOLN
15.0000 mL | Freq: Once | INTRAVENOUS | Status: AC | PRN
  Administered 2015-03-25: 13 mL via INTRAVENOUS

## 2015-04-03 ENCOUNTER — Ambulatory Visit
Admission: RE | Admit: 2015-04-03 | Discharge: 2015-04-03 | Disposition: A | Discharge status: Home / Self Care | Payer: Medicare Other | Source: Ambulatory Visit | Attending: Interventional Radiology | Admitting: Interventional Radiology

## 2015-04-03 DIAGNOSIS — N2889 Other specified disorders of kidney and ureter: Secondary | ICD-10-CM

## 2015-04-03 NOTE — Progress Notes (Signed)
Chief Complaint: Chief Complaint  Patient presents with  . Follow-up    Surveillance of Left Renal Mass   History of Present Illness: Regina [name] is a 74 y.o. female referred by Dr. Karsten Ro last November for evaluation of a left renal mass for possible percutaneous cryoablation. She returns to review and discuss follow-up MRI. She has no complaints referrable to the kidney or abdomen. She continues to complain of problems with her lower legs, feet and hands consisting of pain, edema and skin changes. She has had some workup in the interval since her last visit including podiatry evaluation. She has also been advised to follow a low carbohydrate diet for prediabetes.  Past Medical History  Diagnosis Date  . Asthma   . High cholesterol   . Hypertension   . Chronic kidney disease   . Left renal mass   . Depression   . Arthritis   . Esophageal reflux     No past surgical history on file.  Allergies: Peanuts  Medications: Prior to Admission medications   Medication Sig Start Date End Date Taking? Authorizing Provider  acyclovir (ZOVIRAX) 200 MG capsule Take 1 capsule (200 mg total) by mouth daily. 01/07/15   Lorayne Marek, MD  albuterol (PROVENTIL HFA;VENTOLIN HFA) 108 (90 BASE) MCG/ACT inhaler Inhale 2 puffs into the lungs every 6 (six) hours as needed. For shortness of breath/wheezing 10/31/14   Lorayne Marek, MD  amitriptyline (ELAVIL) 25 MG tablet Take 1 tablet (25 mg total) by mouth at bedtime. 02/13/15   Deanna M Didiano, DO  aspirin EC 81 MG tablet Take 81 mg by mouth daily.    Historical Provider, MD  atorvastatin (LIPITOR) 40 MG tablet Take 1 tablet (40 mg total) by mouth daily. 01/07/15   Lorayne Marek, MD  CALCIUM PO Take by mouth.    Historical Provider, MD  cholecalciferol (VITAMIN D) 1000 UNITS tablet Take 1,000 Units by mouth daily.    Historical Provider, MD  cyclobenzaprine (FLEXERIL) 5 MG tablet Take 1 tablet (5 mg total) by mouth daily as needed. For muscle  spasms 06/06/14   Lorayne Marek, MD  famotidine (PEPCID) 20 MG tablet Take 20 mg by mouth daily.     Historical Provider, MD  furosemide (LASIX) 20 MG tablet Take 1 tablet (20 mg total) by mouth 2 (two) times daily. 02/07/15   Lorayne Marek, MD  HYDROcodone-acetaminophen (NORCO/VICODIN) 5-325 MG per tablet Take 1 tablet by mouth every 6 (six) hours as needed. 01/29/15   Montine Circle, PA-C  hydrocortisone 2.5 % cream Apply topically 2 (two) times daily. 02/07/15   Lorayne Marek, MD  losartan (COZAAR) 100 MG tablet Take 1 tablet (100 mg total) by mouth daily. 02/07/15   Lorayne Marek, MD  potassium chloride SA (K-DUR,KLOR-CON) 20 MEQ tablet Take 1 tablet (20 mEq total) by mouth daily. 02/07/15   Lorayne Marek, MD  UNKNOWN TO PATIENT     Historical Provider, MD  valsartan (DIOVAN) 160 MG tablet  01/07/15   Historical Provider, MD    Family History  Problem Relation Age of Onset  . Heart disease Maternal Grandmother   . Asthma Maternal Grandfather   . Heart disease Maternal Grandfather   . Cancer Mother   . Diabetes Father     History   Social History  . Marital Status: Legally Separated    Spouse Name: N/A  . Number of Children: N/A  . Years of Education: N/A   Social History Main Topics  . Smoking status: Never  Smoker   . Smokeless tobacco: Not on file  . Alcohol Use: No  . Drug Use: No  . Sexual Activity: No   Other Topics Concern  . Not on file   Social History Narrative     Review of Systems: A 12 point ROS discussed and pertinent positives are indicated in the HPI above.  All other systems are negative.  Review of Systems  Constitutional: Negative.   Respiratory: Negative.   Cardiovascular: Negative.   Gastrointestinal: Negative.   Genitourinary: Negative.   Musculoskeletal: Positive for joint swelling and arthralgias.  Neurological: Negative.      Vital Signs: BP 129/69 mmHg  Pulse 76  Temp(Src) 98.4 F (36.9 C) (Oral)  Resp 14  SpO2 100%  Physical Exam    Constitutional: She is oriented to person, place, and time. She appears well-developed and well-nourished. No distress.  Abdominal: Soft. She exhibits no distension. There is no tenderness. There is no rebound and no guarding.  Musculoskeletal: She exhibits edema.  Swelling of fingers.  Neurological: She is alert and oriented to person, place, and time.  Skin: She is not diaphoretic.  Nursing note and vitals reviewed.   Imaging: Mr Abdomen W Wo Contrast  03/25/2015   CLINICAL DATA:  Follow-up left renal mass. History of microscopic hematuria.  EXAM: MRI ABDOMEN WITHOUT AND WITH CONTRAST  TECHNIQUE: Multiplanar multisequence MR imaging of the abdomen was performed both before and after the administration of intravenous contrast.  CONTRAST:  57mL MULTIHANCE GADOBENATE DIMEGLUMINE 529 MG/ML IV SOLN  COMPARISON:  MRI 09/20/2014; CT 08/20/2014  FINDINGS: Lower chest:  Unremarkable  Hepatobiliary: Unchanged multiple hepatic cysts with the largest in the right hepatic lobe measuring 1.3 cm. Gallbladder is unremarkable. No intrahepatic or extrahepatic biliary ductal dilatation.  Pancreas: No significant interval change in multi-lobular cystic and solid exophytic mass arising from the pancreatic tail, measuring approximately 2.7 x 2.1 cm.  Spleen: Unremarkable  Adrenals/Urinary Tract: No significant interval change in size of 1.3 cm exophytic lesion off the interpolar region of the left kidney. This mass is hypo to isointense on T1 and T2 weighted imaging. There is postcontrast enhancement. No hydronephrosis.  Stomach/Bowel: The visualized small and large bowel is unremarkable.  Vascular/Lymphatic: Visualized abdominal aorta is unremarkable. There is increasing retroperitoneal adenopathy with a left periaortic lymph node measuring up to 1.9 cm (image 62; series 1002), previously measuring 1.0 cm.  Other: None  Musculoskeletal: Normal osseous marrow signal.  IMPRESSION: No significant interval change in size of  enhancing 1.3 cm left renal cortical mass, suspicious for renal cell carcinoma.  Increasing retroperitoneal adenopathy with a left periaortic lymph node now measuring 1.9 cm. Given the interval increase in size, metastatic disease is not excluded.  Re- demonstrated cystic and solid pancreatic tail mass which is nonspecific and differential considerations include serous cystadenoma, nonfunctional endocrine tumor such as gastrinoma, mucinous cystadenoma or less likely SPEN.  These results will be called to the ordering clinician or representative by the Radiologist Assistant, and communication documented in the PACS or zVision Dashboard.   Electronically Signed   By: Lovey Newcomer M.D.   On: 03/25/2015 15:59    Labs:  CBC:  Recent Labs  05/25/14 1702 11/26/14 1425  WBC  --  5.3  HGB 12.9 11.1*  HCT 38.0 34.6*  PLT  --  225    COAGS: No results for input(s): INR, APTT in the last 8760 hours.  BMP:  Recent Labs  10/31/14 0957 11/26/14 1425 01/07/15 1326 02/07/15 1113  03/07/15 1201  NA 138 140 138 138  --   K 4.4 3.9 4.0 4.4  --   CL 102 102 100 102  --   CO2 26 27 27 27   --   GLUCOSE 96 89 91 97  --   BUN 12 12 13 8 7   CALCIUM 9.4 9.3 9.3 9.7  --   CREATININE 0.65 0.65 0.72 0.61 0.67  GFRNONAA >89 87* 85 >89 89  GFRAA >89 >90 >89 >89 >89    LIVER FUNCTION TESTS:  Recent Labs  10/31/14 0957 11/26/14 1425 01/07/15 1326 02/07/15 1113  BILITOT 0.6 0.4 0.6 0.5  AST 25 28 24 27   ALT 15 18 20 19   ALKPHOS 89 108 97 103  PROT 6.8 7.0 6.9 7.0  ALBUMIN 4.0 3.6 3.9 4.3     Assessment and Plan:  I reviewed MRI findings with Regina [name]. The current MRI on 03/25/2015 shows a stable 1.3 cm lesion of the lateral left mid kidney demonstrating mild enhancement. This lesion shows no growth over the last 6 months since a prior MRI on 09/20/2014.  The MRI also shows a stable cystic lesion of the tail of the pancreas.  Given evidence of no growth of the left renal lesion as well as  persistent surrounding colon abutting the lesion, I recommended that we continue to pursue surveillance of the lesion for growth.  I recommended another MRI in one year. The patient is agreeable. We will contact her next year and schedule a follow-up MRI in April, 2017.    The cystic lesion involving the tail of the pancreas also appears stable. This is in a difficult location to likely characterize by EUS, as it lies in the far posterolateral retroperitoneum posterior to the colon and abutting the posterolateral aspect of the kidney. The patient has not been referred to Gastroenterology for an opinion regarding this lesion. I have recommended we seek the opinion of a gastroenterologist. We will help to facilitate referral to Dr. Oretha Caprice.   SignedAletta Edouard T 04/03/2015, 11:42 AM     I spent a total of 15 minutes face to face in clinical consultation, greater than 50% of which was counseling/coordinating care for a left renal mass.

## 2015-04-04 ENCOUNTER — Telehealth: Payer: Self-pay

## 2015-04-04 NOTE — Telephone Encounter (Signed)
Monica Martinez,    I read your note, reviewed her MRI. We'll get her in to discuss. Thanks      Dany Walther,   She needs next NGI appt with me to discuss abnormal (but stable) pancreas      Thanks      ----- Message -----    From: Aletta Edouard, MD    Sent: 04/03/2015 12:34 PM     To: Milus Banister, MD, Lorayne Marek, MD   Subject: Inpatient Notes

## 2015-04-04 NOTE — Telephone Encounter (Signed)
Left message on machine to call back  

## 2015-04-07 NOTE — Telephone Encounter (Signed)
noted 

## 2015-04-07 NOTE — Telephone Encounter (Signed)
Spoke w/pt- She said needed to see her PCP and get a referral first due to her insurance and will CB to sch'd

## 2015-04-08 ENCOUNTER — Emergency Department (HOSPITAL_COMMUNITY)
Admission: EM | Admit: 2015-04-08 | Discharge: 2015-04-08 | Disposition: A | Discharge status: Home / Self Care | Payer: Medicare Other | Attending: Emergency Medicine | Admitting: Emergency Medicine

## 2015-04-08 ENCOUNTER — Encounter (HOSPITAL_COMMUNITY): Payer: Self-pay | Admitting: Neurology

## 2015-04-08 DIAGNOSIS — N189 Chronic kidney disease, unspecified: Secondary | ICD-10-CM | POA: Insufficient documentation

## 2015-04-08 DIAGNOSIS — M1990 Unspecified osteoarthritis, unspecified site: Secondary | ICD-10-CM | POA: Insufficient documentation

## 2015-04-08 DIAGNOSIS — Z87448 Personal history of other diseases of urinary system: Secondary | ICD-10-CM | POA: Insufficient documentation

## 2015-04-08 DIAGNOSIS — F329 Major depressive disorder, single episode, unspecified: Secondary | ICD-10-CM | POA: Insufficient documentation

## 2015-04-08 DIAGNOSIS — E780 Pure hypercholesterolemia: Secondary | ICD-10-CM | POA: Diagnosis not present

## 2015-04-08 DIAGNOSIS — R21 Rash and other nonspecific skin eruption: Secondary | ICD-10-CM

## 2015-04-08 DIAGNOSIS — Z859 Personal history of malignant neoplasm, unspecified: Secondary | ICD-10-CM | POA: Diagnosis not present

## 2015-04-08 DIAGNOSIS — L299 Pruritus, unspecified: Secondary | ICD-10-CM | POA: Diagnosis present

## 2015-04-08 DIAGNOSIS — K219 Gastro-esophageal reflux disease without esophagitis: Secondary | ICD-10-CM | POA: Diagnosis not present

## 2015-04-08 DIAGNOSIS — I129 Hypertensive chronic kidney disease with stage 1 through stage 4 chronic kidney disease, or unspecified chronic kidney disease: Secondary | ICD-10-CM | POA: Insufficient documentation

## 2015-04-08 DIAGNOSIS — F419 Anxiety disorder, unspecified: Secondary | ICD-10-CM | POA: Diagnosis not present

## 2015-04-08 DIAGNOSIS — R2243 Localized swelling, mass and lump, lower limb, bilateral: Secondary | ICD-10-CM | POA: Insufficient documentation

## 2015-04-08 DIAGNOSIS — L509 Urticaria, unspecified: Secondary | ICD-10-CM | POA: Diagnosis not present

## 2015-04-08 DIAGNOSIS — Z79899 Other long term (current) drug therapy: Secondary | ICD-10-CM | POA: Insufficient documentation

## 2015-04-08 DIAGNOSIS — J45909 Unspecified asthma, uncomplicated: Secondary | ICD-10-CM | POA: Diagnosis not present

## 2015-04-08 DIAGNOSIS — Z7952 Long term (current) use of systemic steroids: Secondary | ICD-10-CM | POA: Insufficient documentation

## 2015-04-08 DIAGNOSIS — R350 Frequency of micturition: Secondary | ICD-10-CM | POA: Diagnosis not present

## 2015-04-08 HISTORY — DX: Malignant (primary) neoplasm, unspecified: C80.1

## 2015-04-08 LAB — URINALYSIS, ROUTINE W REFLEX MICROSCOPIC
Bilirubin Urine: NEGATIVE
Glucose, UA: NEGATIVE mg/dL
KETONES UR: NEGATIVE mg/dL
LEUKOCYTES UA: NEGATIVE
NITRITE: NEGATIVE
Protein, ur: NEGATIVE mg/dL
SPECIFIC GRAVITY, URINE: 1.007 (ref 1.005–1.030)
UROBILINOGEN UA: 0.2 mg/dL (ref 0.0–1.0)
pH: 8 (ref 5.0–8.0)

## 2015-04-08 LAB — CBC
HCT: 31.6 % — ABNORMAL LOW (ref 36.0–46.0)
Hemoglobin: 10.2 g/dL — ABNORMAL LOW (ref 12.0–15.0)
MCH: 25.3 pg — AB (ref 26.0–34.0)
MCHC: 32.3 g/dL (ref 30.0–36.0)
MCV: 78.4 fL (ref 78.0–100.0)
PLATELETS: 209 10*3/uL (ref 150–400)
RBC: 4.03 MIL/uL (ref 3.87–5.11)
RDW: 15.6 % — ABNORMAL HIGH (ref 11.5–15.5)
WBC: 4.3 10*3/uL (ref 4.0–10.5)

## 2015-04-08 LAB — COMPREHENSIVE METABOLIC PANEL
ALT: 17 U/L (ref 0–35)
AST: 24 U/L (ref 0–37)
Albumin: 3.7 g/dL (ref 3.5–5.2)
Alkaline Phosphatase: 91 U/L (ref 39–117)
Anion gap: 9 (ref 5–15)
BUN: 8 mg/dL (ref 6–23)
CALCIUM: 9.3 mg/dL (ref 8.4–10.5)
CO2: 27 mmol/L (ref 19–32)
CREATININE: 0.6 mg/dL (ref 0.50–1.10)
Chloride: 102 mmol/L (ref 96–112)
GFR, EST NON AFRICAN AMERICAN: 90 mL/min — AB (ref >90)
Glucose, Bld: 105 mg/dL — ABNORMAL HIGH (ref 70–99)
POTASSIUM: 3.8 mmol/L (ref 3.5–5.1)
SODIUM: 138 mmol/L (ref 135–145)
Total Bilirubin: 0.5 mg/dL (ref 0.3–1.2)
Total Protein: 7.1 g/dL (ref 6.0–8.3)

## 2015-04-08 LAB — URINE MICROSCOPIC-ADD ON

## 2015-04-08 LAB — CBG MONITORING, ED: Glucose-Capillary: 96 mg/dL (ref 70–99)

## 2015-04-08 MED ORDER — DIPHENHYDRAMINE HCL 50 MG/ML IJ SOLN
25.0000 mg | Freq: Once | INTRAMUSCULAR | Status: AC
  Administered 2015-04-08: 25 mg via INTRAMUSCULAR
  Filled 2015-04-08: qty 1

## 2015-04-08 MED ORDER — HYDROCORTISONE 2.5 % EX CREA
TOPICAL_CREAM | Freq: Two times a day (BID) | CUTANEOUS | Status: AC
Start: 2015-04-08 — End: ?

## 2015-04-08 NOTE — ED Notes (Signed)
MD at bedside. 

## 2015-04-08 NOTE — ED Provider Notes (Signed)
CSN: [id number]     Arrival date & time 04/08/15  1220 History   First MD Initiated Contact with Patient 04/08/15 1337     Chief Complaint  Patient presents with  . Leg Problem  . Pruritis  . Urinary Frequency     (Consider location/radiation/quality/duration/timing/severity/associated sxs/prior Treatment) The history is provided by the patient.  Regina [name] is a 74 y.o. female hx of asthma, HL, HTN, CKD here presenting with frequent urination, itchiness, rash, leg swelling. She states that she's been having leg swelling for several months. She has been seen an allergist and dermatologist but didn't get an answer.She states that rash just comes and goes. She has been taking lasix for several weeks, which helped the leg swelling but often makes the rash worse. Denies chest pain or shortness of breath. Of note, she has hx of renal mass and has been watched by her doctor. She had an MRI abdomen recently and was told to be fine but was called today and was told that it may be abnormal. MRI showed increased retroperitoneal nodes. She denies abdominal pain or vomiting or weight loss. She does have urinary frequency.    Past Medical History  Diagnosis Date  . Asthma   . High cholesterol   . Hypertension   . Chronic kidney disease   . Left renal mass   . Depression   . Arthritis   . Esophageal reflux   . Cancer    History reviewed. No pertinent past surgical history. Family History  Problem Relation Age of Onset  . Heart disease Maternal Grandmother   . Asthma Maternal Grandfather   . Heart disease Maternal Grandfather   . Cancer Mother   . Diabetes Father    History  Substance Use Topics  . Smoking status: Never Smoker   . Smokeless tobacco: Not on file  . Alcohol Use: No   OB History    Gravida Para Term Preterm AB TAB SAB Ectopic Multiple Living   7 6 6  1 1    6      Review of Systems  Cardiovascular: Positive for leg swelling.  Genitourinary: Positive for frequency.   Skin: Positive for rash.  All other systems reviewed and are negative.     Allergies  Peanuts  Home Medications   Prior to Admission medications   Medication Sig Start Date End Date Taking? Authorizing Provider  acyclovir (ZOVIRAX) 200 MG capsule Take 1 capsule (200 mg total) by mouth daily. 01/07/15  Yes Lorayne Marek, MD  albuterol (PROVENTIL HFA;VENTOLIN HFA) 108 (90 BASE) MCG/ACT inhaler Inhale 2 puffs into the lungs every 6 (six) hours as needed. For shortness of breath/wheezing 10/31/14  Yes Lorayne Marek, MD  aspirin EC 81 MG tablet Take 81 mg by mouth daily.   Yes Historical Provider, MD  atorvastatin (LIPITOR) 40 MG tablet Take 1 tablet (40 mg total) by mouth daily. Patient taking differently: Take 40 mg by mouth daily at 6 PM.  01/07/15  Yes Deepak Advani, MD  gabapentin (NEURONTIN) 300 MG capsule Take 300 mg by mouth at bedtime.   Yes Historical Provider, MD  losartan (COZAAR) 100 MG tablet Take 1 tablet (100 mg total) by mouth daily. 02/07/15  Yes Lorayne Marek, MD  Menthol, Topical Analgesic, (GOLD BOND PAIN RELIEVING FOOT EX) Apply 1 application topically 2 (two) times daily.   Yes Historical Provider, MD  triamcinolone cream (KENALOG) 0.1 % Apply topically 2 (two) times daily. 02/21/15  Yes Historical Provider, MD  amitriptyline (  ELAVIL) 25 MG tablet Take 1 tablet (25 mg total) by mouth at bedtime. 02/13/15   Deanna M Didiano, DO  CALCIUM PO Take by mouth.    Historical Provider, MD  cholecalciferol (VITAMIN D) 1000 UNITS tablet Take 1,000 Units by mouth daily.    Historical Provider, MD  cyclobenzaprine (FLEXERIL) 5 MG tablet Take 1 tablet (5 mg total) by mouth daily as needed. For muscle spasms 06/06/14   Lorayne Marek, MD  famotidine (PEPCID) 20 MG tablet Take 20 mg by mouth daily.     Historical Provider, MD  furosemide (LASIX) 20 MG tablet Take 1 tablet (20 mg total) by mouth 2 (two) times daily. 02/07/15   Lorayne Marek, MD  HYDROcodone-acetaminophen (NORCO/VICODIN) 5-325  MG per tablet Take 1 tablet by mouth every 6 (six) hours as needed. Patient not taking: Reported on 04/08/2015 01/29/15   Montine Circle, PA-C  hydrocortisone 2.5 % cream Apply topically 2 (two) times daily. Patient not taking: Reported on 04/08/2015 02/07/15   Lorayne Marek, MD  potassium chloride SA (K-DUR,KLOR-CON) 20 MEQ tablet Take 1 tablet (20 mEq total) by mouth daily. 02/07/15   Lorayne Marek, MD  UNKNOWN TO PATIENT     Historical Provider, MD   BP 143/87 mmHg  Pulse 99  Temp(Src) 98.2 F (36.8 C) (Oral)  Resp 18  SpO2 100% Physical Exam  Constitutional: She is oriented to person, place, and time.  Anxious   HENT:  Head: Normocephalic.  Mouth/Throat: Oropharynx is clear and moist.  Eyes: Conjunctivae are normal. Pupils are equal, round, and reactive to light.  Neck: Normal range of motion. Neck supple.  Cardiovascular: Normal rate, regular rhythm and normal heart sounds.   Pulmonary/Chest: Effort normal and breath sounds normal. No respiratory distress. She has no wheezes. She has no rales.  Abdominal: Soft. Bowel sounds are normal. She exhibits no distension. There is no tenderness. There is no rebound.  Musculoskeletal:  1+ edema bilaterally, no calf tenderness   Neurological: She is alert and oriented to person, place, and time.  Skin:  Mild urticaria on arms, not involving face or torso   Psychiatric:  Anxious   Nursing note and vitals reviewed.   ED Course  Procedures (including critical care time) Labs Review Labs Reviewed  COMPREHENSIVE METABOLIC PANEL - Abnormal; Notable for the following:    Glucose, Bld 105 (*)    GFR calc non Af Amer 90 (*)    All other components within normal limits  CBC - Abnormal; Notable for the following:    Hemoglobin 10.2 (*)    HCT 31.6 (*)    MCH 25.3 (*)    RDW 15.6 (*)    All other components within normal limits  URINALYSIS, ROUTINE W REFLEX MICROSCOPIC - Abnormal; Notable for the following:    Hgb urine dipstick TRACE (*)     All other components within normal limits  URINE MICROSCOPIC-ADD ON  CBG MONITORING, ED    Imaging Review No results found.   EKG Interpretation None      MDM   Final diagnoses:  None   Regina [name] is a 74 y.o. female here with urinary frequency, leg swelling, rash. I don't know why she has mild allergic reaction. She has been using lasix more often and its possible she is allergic to that. No signs of angioedema or anaphylaxis. She has seen multiple specialist and has no answers. She wants to see rheumatology for her arthritis and has f/u regarding her renal mass. Will check  labs and UA. Will give benadryl. Will dc lasix for now.   3:50 PM Labs and UA unremarkable. Will dc home.     Wandra Arthurs, MD 04/08/15 [phone]

## 2015-04-08 NOTE — Discharge Instructions (Signed)
Stop taking lasix for now.   Take benadryl 25 mg every 6 hrs for itchiness.   Talk to your doctor regarding starting another fluid pill if you have more leg swelling.   See rheumatology for your arthritis.   Return to ER if you have worse itchiness, rash, trouble breathing.

## 2015-04-08 NOTE — ED Notes (Addendum)
Pt reports frequent urination, burning and numbness "hardness" in both legs. Pain to both hands in the mornings. Also itching to skin all over. This has been going on for several weeks.

## 2015-04-08 NOTE — ED Notes (Signed)
CBG 96. 

## 2015-04-14 ENCOUNTER — Ambulatory Visit: Payer: Medicare Other | Attending: Internal Medicine | Admitting: Internal Medicine

## 2015-04-14 ENCOUNTER — Encounter: Payer: Self-pay | Admitting: Internal Medicine

## 2015-04-14 VITALS — BP 128/74 | HR 72 | Temp 98.8°F | Resp 15 | Wt 145.4 lb

## 2015-04-14 DIAGNOSIS — K219 Gastro-esophageal reflux disease without esophagitis: Secondary | ICD-10-CM | POA: Diagnosis not present

## 2015-04-14 DIAGNOSIS — I10 Essential (primary) hypertension: Secondary | ICD-10-CM | POA: Insufficient documentation

## 2015-04-14 DIAGNOSIS — Z8619 Personal history of other infectious and parasitic diseases: Secondary | ICD-10-CM | POA: Diagnosis not present

## 2015-04-14 DIAGNOSIS — M2550 Pain in unspecified joint: Secondary | ICD-10-CM | POA: Diagnosis not present

## 2015-04-14 DIAGNOSIS — R7303 Prediabetes: Secondary | ICD-10-CM

## 2015-04-14 DIAGNOSIS — K8689 Other specified diseases of pancreas: Secondary | ICD-10-CM

## 2015-04-14 DIAGNOSIS — K869 Disease of pancreas, unspecified: Secondary | ICD-10-CM

## 2015-04-14 DIAGNOSIS — R7309 Other abnormal glucose: Secondary | ICD-10-CM | POA: Diagnosis not present

## 2015-04-14 LAB — POCT GLYCOSYLATED HEMOGLOBIN (HGB A1C): Hemoglobin A1C: 5.9

## 2015-04-14 MED ORDER — TRIAMCINOLONE ACETONIDE 0.1 % EX CREA: TOPICAL_CREAM | Freq: Two times a day (BID) | CUTANEOUS | Status: AC

## 2015-04-14 MED ORDER — FAMOTIDINE 20 MG PO TABS: 20.0000 mg | ORAL_TABLET | Freq: Every day | ORAL | Status: AC

## 2015-04-14 MED ORDER — AMITRIPTYLINE HCL 25 MG PO TABS: 25.0000 mg | ORAL_TABLET | Freq: Every day | ORAL | Status: AC

## 2015-04-14 MED ORDER — ACYCLOVIR 200 MG PO CAPS: 200.0000 mg | ORAL_CAPSULE | Freq: Every day | ORAL | Status: AC

## 2015-04-14 NOTE — Progress Notes (Signed)
Patient here for follow up on her blood pressure and  For medication refills

## 2015-04-14 NOTE — Progress Notes (Signed)
MRN: [id number] Name: Regina [name]  Sex: female Age: 74 y.o. DOB: 04-10-1941  Allergies: Peanuts  Chief Complaint  Patient presents with  . Follow-up    HPI: Patient is 74 y.o. female who has history of hypertension, prediabetes, also has been diagnosed with renal mass following up with vascular surgery, she had a recent MRI done and was noted to have pancreatic mass, she was told that she needs to see a gastroenterologist and today she's requesting referral, she also complains of chronic bilateral joint pain, as per patient when she was in [place] she was told she is given arthritis, most of her blood work was negative including ANA, rheumatoid factor, will check anti-CCP antibodies., she was also seen by dermatologist as per patient she was prescribed cream.she also has GERD requesting refill on Pepcid. History of herpes she has been taking acyclovir.  Past Medical History  Diagnosis Date  . Asthma   . High cholesterol   . Hypertension   . Chronic kidney disease   . Left renal mass   . Depression   . Arthritis   . Esophageal reflux   . Cancer     History reviewed. No pertinent past surgical history.    Medication List       This list is accurate as of: 04/14/15  3:13 PM.  Always use your most recent med list.               acyclovir 200 MG capsule  Commonly known as:  ZOVIRAX  Take 1 capsule (200 mg total) by mouth daily.     albuterol 108 (90 BASE) MCG/ACT inhaler  Commonly known as:  PROVENTIL HFA;VENTOLIN HFA  Inhale 2 puffs into the lungs every 6 (six) hours as needed. For shortness of breath/wheezing     amitriptyline 25 MG tablet  Commonly known as:  ELAVIL  Take 1 tablet (25 mg total) by mouth at bedtime.     aspirin EC 81 MG tablet  Take 81 mg by mouth daily.     atorvastatin 40 MG tablet  Commonly known as:  LIPITOR  Take 1 tablet (40 mg total) by mouth daily.     CALCIUM PO  Take 1 tablet by mouth daily.     cholecalciferol 1000 UNITS  tablet  Commonly known as:  VITAMIN D  Take 1,000 Units by mouth daily.     cyclobenzaprine 5 MG tablet  Commonly known as:  FLEXERIL  Take 1 tablet (5 mg total) by mouth daily as needed. For muscle spasms     famotidine 20 MG tablet  Commonly known as:  PEPCID  Take 1 tablet (20 mg total) by mouth daily.     furosemide 20 MG tablet  Commonly known as:  LASIX  Take 1 tablet (20 mg total) by mouth 2 (two) times daily.     gabapentin 300 MG capsule  Commonly known as:  NEURONTIN  Take 300 mg by mouth at bedtime.     GOLD BOND PAIN RELIEVING FOOT EX  Apply 1 application topically 2 (two) times daily.     HYDROcodone-acetaminophen 5-325 MG per tablet  Commonly known as:  NORCO/VICODIN  Take 1 tablet by mouth every 6 (six) hours as needed.     hydrocortisone 2.5 % cream  Apply topically 2 (two) times daily.     losartan 100 MG tablet  Commonly known as:  COZAAR  Take 1 tablet (100 mg total) by mouth daily.  potassium chloride SA 20 MEQ tablet  Commonly known as:  K-DUR,KLOR-CON  Take 1 tablet (20 mEq total) by mouth daily.     triamcinolone cream 0.1 %  Commonly known as:  KENALOG  Apply topically 2 (two) times daily.        Meds ordered this encounter  Medications  . acyclovir (ZOVIRAX) 200 MG capsule    Sig: Take 1 capsule (200 mg total) by mouth daily.    Dispense:  30 capsule    Refill:  3  . amitriptyline (ELAVIL) 25 MG tablet    Sig: Take 1 tablet (25 mg total) by mouth at bedtime.    Dispense:  30 tablet    Refill:  1  . famotidine (PEPCID) 20 MG tablet    Sig: Take 1 tablet (20 mg total) by mouth daily.    Dispense:  30 tablet    Refill:  2  . triamcinolone cream (KENALOG) 0.1 %    Sig: Apply topically 2 (two) times daily.    Dispense:  30 g    Refill:  0     There is no immunization history on file for this patient.  Family History  Problem Relation Age of Onset  . Heart disease Maternal Grandmother   . Asthma Maternal Grandfather   .  Heart disease Maternal Grandfather   . Cancer Mother   . Diabetes Father     History  Substance Use Topics  . Smoking status: Never Smoker   . Smokeless tobacco: Not on file  . Alcohol Use: No    Review of Systems   As noted in HPI  Filed Vitals:   04/14/15 1413  BP: 128/74  Pulse: 72  Temp: 98.8 F (37.1 C)  Resp: 15    Physical Exam  Physical Exam  Constitutional: No distress.  Eyes: EOM are normal. Pupils are equal, round, and reactive to light.  Cardiovascular: Normal rate and regular rhythm.   Pulmonary/Chest: Breath sounds normal. No respiratory distress. She has no wheezes. She has no rales.  Musculoskeletal:  Again noted Rash/scab formation on left left no tenderness or warmth or signs of infection.  1+ lower extremity edema L>R      CBC    Component Value Date/Time   WBC 4.3 04/08/2015 1315   RBC 4.03 04/08/2015 1315   RBC 4.51 03/01/2014 1143   HGB 10.2* 04/08/2015 1315   HCT 31.6* 04/08/2015 1315   PLT 209 04/08/2015 1315   MCV 78.4 04/08/2015 1315   LYMPHSABS 1.3 11/26/2014 1425   MONOABS 0.3 11/26/2014 1425   EOSABS 0.9* 11/26/2014 1425   BASOSABS 0.0 11/26/2014 1425    CMP     Component Value Date/Time   NA 138 04/08/2015 1315   K 3.8 04/08/2015 1315   CL 102 04/08/2015 1315   CO2 27 04/08/2015 1315   GLUCOSE 105* 04/08/2015 1315   BUN 8 04/08/2015 1315   CREATININE 0.60 04/08/2015 1315   CREATININE 0.67 03/07/2015 1201   CALCIUM 9.3 04/08/2015 1315   PROT 7.1 04/08/2015 1315   ALBUMIN 3.7 04/08/2015 1315   AST 24 04/08/2015 1315   ALT 17 04/08/2015 1315   ALKPHOS 91 04/08/2015 1315   BILITOT 0.5 04/08/2015 1315   GFRNONAA 90* 04/08/2015 1315   GFRNONAA 89 03/07/2015 1201   GFRAA >90 04/08/2015 1315   GFRAA >89 03/07/2015 1201    Lab Results  Component Value Date/Time   CHOL 216* 10/31/2014 09:57 AM    Lab Results  Component  Value Date/Time   HGBA1C 5.90 04/14/2015 02:41 PM    Lab Results  Component Value  Date/Time   AST 24 04/08/2015 01:15 PM    Assessment and Plan  Essential hypertension, benign The patient is well-controlled continued current meds losartan /Lasix  Pre-diabetes - Plan:  Results for orders placed or performed in visit on 04/14/15  HgB A1c  Result Value Ref Range   Hemoglobin A1C 5.90   Advised patient for low carbohydrate diet.  Gastroesophageal reflux disease, esophagitis presence not specified Lifestyle modification continue with Pepcid  History of herpes labialis - Plan: acyclovir (ZOVIRAX) 200 MG capsule  Pancreatic mass - Plan: Ambulatory referral to Gastroenterology  Multiple joint pain - Plan:her uric acid, rheumatoid factor, ANA was negative, will check  Cyclic citrul peptide antibody, IgG   Return in about 3 months (around 07/14/2015), or if symptoms worsen or fail to improve, for hypertension.   This note has been created with Surveyor, quantity. Any transcriptional errors are unintentional.    Lorayne Marek, MD

## 2015-04-14 NOTE — Patient Instructions (Signed)
Diabetes Mellitus and Food It is important for you to manage your blood sugar (glucose) level. Your blood glucose level can be greatly affected by what you eat. Eating healthier foods in the appropriate amounts throughout the day at about the same time each day will help you control your blood glucose level. It can also help slow or prevent worsening of your diabetes mellitus. Healthy eating may even help you improve the level of your blood pressure and reach or maintain a healthy weight.  HOW CAN FOOD AFFECT ME? Carbohydrates Carbohydrates affect your blood glucose level more than any other type of food. Your dietitian will help you determine how many carbohydrates to eat at each meal and teach you how to count carbohydrates. Counting carbohydrates is important to keep your blood glucose at a healthy level, especially if you are using insulin or taking certain medicines for diabetes mellitus. Alcohol Alcohol can cause sudden decreases in blood glucose (hypoglycemia), especially if you use insulin or take certain medicines for diabetes mellitus. Hypoglycemia can be a life-threatening condition. Symptoms of hypoglycemia (sleepiness, dizziness, and disorientation) are similar to symptoms of having too much alcohol.  If your health care provider has given you approval to drink alcohol, do so in moderation and use the following guidelines:  Women should not have more than one drink per day, and men should not have more than two drinks per day. One drink is equal to:  12 oz of beer.  5 oz of wine.  1 oz of hard liquor.  Do not drink on an empty stomach.  Keep yourself hydrated. Have water, diet soda, or unsweetened iced tea.  Regular soda, juice, and other mixers might contain a lot of carbohydrates and should be counted. WHAT FOODS ARE NOT RECOMMENDED? As you make food choices, it is important to remember that all foods are not the same. Some foods have fewer nutrients per serving than other  foods, even though they might have the same number of calories or carbohydrates. It is difficult to get your body what it needs when you eat foods with fewer nutrients. Examples of foods that you should avoid that are high in calories and carbohydrates but low in nutrients include:  Trans fats (most processed foods list trans fats on the Nutrition Facts label).  Regular soda.  Juice.  Candy.  Sweets, such as cake, pie, doughnuts, and cookies.  Fried foods. WHAT FOODS CAN I EAT? Have nutrient-rich foods, which will nourish your body and keep you healthy. The food you should eat also will depend on several factors, including:  The calories you need.  The medicines you take.  Your weight.  Your blood glucose level.  Your blood pressure level.  Your cholesterol level. You also should eat a variety of foods, including:  Protein, such as meat, poultry, fish, tofu, nuts, and seeds (lean animal proteins are best).  Fruits.  Vegetables.  Dairy products, such as milk, cheese, and yogurt (low fat is best).  Breads, grains, pasta, cereal, rice, and beans.  Fats such as olive oil, trans fat-free margarine, canola oil, avocado, and olives. DOES EVERYONE WITH DIABETES MELLITUS HAVE THE SAME MEAL PLAN? Because every person with diabetes mellitus is different, there is not one meal plan that works for everyone. It is very important that you meet with a dietitian who will help you create a meal plan that is just right for you. Document Released: 09/02/2005 Document Revised: 12/11/2013 Document Reviewed: 11/02/2013 [employer] Patient Information 2015 [employer]. This   information is not intended to replace advice given to you by your health care provider. Make sure you discuss any questions you have with your health care provider.  

## 2015-04-15 ENCOUNTER — Telehealth: Payer: Self-pay | Admitting: Internal Medicine

## 2015-04-15 LAB — CYCLIC CITRUL PEPTIDE ANTIBODY, IGG: Cyclic Citrullin Peptide Ab: <2 U/mL (ref 0.0–5.0)

## 2015-04-15 NOTE — Telephone Encounter (Signed)
Pt calling to request referral for hand pain, not sure if she should see a rheumatologist.  Please f/u with pt.

## 2015-04-16 ENCOUNTER — Telehealth: Payer: Self-pay

## 2015-04-16 NOTE — Telephone Encounter (Signed)
-----   Message from Lorayne Marek, MD sent at 04/16/2015  9:40 AM EDT ----- Call and let the Patient know that blood work is normal.

## 2015-04-16 NOTE — Telephone Encounter (Signed)
Gave patient results of her lab work

## 2015-04-16 NOTE — Telephone Encounter (Signed)
Patient not available Left message on voice mail to return our call 

## 2015-04-21 ENCOUNTER — Telehealth: Payer: Self-pay | Admitting: *Deleted

## 2015-04-21 NOTE — Telephone Encounter (Signed)
RN called in to say she had med with the patient this morning and the patient is not taking her losartan,potassium, or atorvastatin on a regular basis.  She wanted me to follow up with the patient.  Left HIPPA compliant message for patient to return my call so that we could determine why she is not taking her medications as prescribed.

## 2015-04-22 ENCOUNTER — Telehealth: Payer: Self-pay

## 2015-04-22 NOTE — Telephone Encounter (Signed)
-----   Message from Darden Dates sent at 04/22/2015 10:53 AM EDT ----- [phone] Pt now has a referral in the system for Medicaid.  There is in a note that you were working on an appt for this pt.  Thanks Amy

## 2015-04-23 ENCOUNTER — Telehealth: Payer: Self-pay

## 2015-04-23 ENCOUNTER — Encounter: Payer: Self-pay | Admitting: Gastroenterology

## 2015-04-23 NOTE — Telephone Encounter (Signed)
Chalsea Darko,   She needs next NGI appt with me to discuss abnormal (but stable) pancreas

## 2015-04-23 NOTE — Telephone Encounter (Signed)
Pt has been scheduled for appt and is aware of the date and time

## 2015-04-23 NOTE — Telephone Encounter (Signed)
Returned patient phone call Patient not available Left message on voice mail to return our call 

## 2015-05-13 ENCOUNTER — Emergency Department (INDEPENDENT_AMBULATORY_CARE_PROVIDER_SITE_OTHER)
Admission: EM | Admit: 2015-05-13 | Discharge: 2015-05-13 | Disposition: A | Discharge status: Home / Self Care | Payer: Medicare Other | Source: Home / Self Care | Attending: Family Medicine | Admitting: Family Medicine

## 2015-05-13 ENCOUNTER — Encounter (HOSPITAL_COMMUNITY): Payer: Self-pay | Admitting: *Deleted

## 2015-05-13 DIAGNOSIS — B86 Scabies: Secondary | ICD-10-CM

## 2015-05-13 MED ORDER — PERMETHRIN 5 % EX CREA: TOPICAL_CREAM | CUTANEOUS | Status: AC

## 2015-05-13 NOTE — ED Provider Notes (Signed)
CSN: [id number]     Arrival date & time 05/13/15  1446 History   First MD Initiated Contact with Patient 05/13/15 1548     No chief complaint on file.  (Consider location/radiation/quality/duration/timing/severity/associated sxs/prior Treatment) Patient is a 74 y.o. female presenting with rash. The history is provided by the patient.  Rash Location:  Full body Quality: blistering, dryness, itchiness and redness   Severity:  Moderate Onset quality:  Gradual Duration:  6 months Progression:  Spreading Chronicity:  Chronic Context comment:  Pt seen by numerous physicians for eval and has tried home remedies without resolution.new lesions continue to appear.   Past Medical History  Diagnosis Date  . Asthma   . High cholesterol   . Hypertension   . Chronic kidney disease   . Left renal mass   . Depression   . Arthritis   . Esophageal reflux   . Cancer    No past surgical history on file. Family History  Problem Relation Age of Onset  . Heart disease Maternal Grandmother   . Asthma Maternal Grandfather   . Heart disease Maternal Grandfather   . Cancer Mother   . Diabetes Father    History  Substance Use Topics  . Smoking status: Never Smoker   . Smokeless tobacco: Not on file  . Alcohol Use: No   OB History    Gravida Para Term Preterm AB TAB SAB Ectopic Multiple Living   7 6 6  1 1    6      Review of Systems  Constitutional: Negative.   Skin: Positive for rash.    Allergies  Peanuts  Home Medications   Prior to Admission medications   Medication Sig Start Date End Date Taking? Authorizing Provider  acyclovir (ZOVIRAX) 200 MG capsule Take 1 capsule (200 mg total) by mouth daily. 04/14/15   Lorayne Marek, MD  albuterol (PROVENTIL HFA;VENTOLIN HFA) 108 (90 BASE) MCG/ACT inhaler Inhale 2 puffs into the lungs every 6 (six) hours as needed. For shortness of breath/wheezing 10/31/14   Lorayne Marek, MD  amitriptyline (ELAVIL) 25 MG tablet Take 1 tablet (25 mg total)  by mouth at bedtime. 04/14/15   Lorayne Marek, MD  aspirin EC 81 MG tablet Take 81 mg by mouth daily.    Historical Provider, MD  atorvastatin (LIPITOR) 40 MG tablet Take 1 tablet (40 mg total) by mouth daily. Patient taking differently: Take 40 mg by mouth daily at 6 PM.  01/07/15   Lorayne Marek, MD  CALCIUM PO Take 1 tablet by mouth daily.     Historical Provider, MD  cholecalciferol (VITAMIN D) 1000 UNITS tablet Take 1,000 Units by mouth daily.    Historical Provider, MD  cyclobenzaprine (FLEXERIL) 5 MG tablet Take 1 tablet (5 mg total) by mouth daily as needed. For muscle spasms 06/06/14   Lorayne Marek, MD  famotidine (PEPCID) 20 MG tablet Take 1 tablet (20 mg total) by mouth daily. 04/14/15   Lorayne Marek, MD  furosemide (LASIX) 20 MG tablet Take 1 tablet (20 mg total) by mouth 2 (two) times daily. 02/07/15   Lorayne Marek, MD  gabapentin (NEURONTIN) 300 MG capsule Take 300 mg by mouth at bedtime.    Historical Provider, MD  HYDROcodone-acetaminophen (NORCO/VICODIN) 5-325 MG per tablet Take 1 tablet by mouth every 6 (six) hours as needed. Patient not taking: Reported on 04/08/2015 01/29/15   Montine Circle, PA-C  hydrocortisone 2.5 % cream Apply topically 2 (two) times daily. 04/08/15   Wandra Arthurs, MD  losartan (COZAAR) 100 MG tablet Take 1 tablet (100 mg total) by mouth daily. 02/07/15   Lorayne Marek, MD  Menthol, Topical Analgesic, (GOLD BOND PAIN RELIEVING FOOT EX) Apply 1 application topically 2 (two) times daily.    Historical Provider, MD  permethrin (ELIMITE) 5 % cream Use as directed and repeat in 1 week. 05/13/15   Billy Fischer, MD  potassium chloride SA (K-DUR,KLOR-CON) 20 MEQ tablet Take 1 tablet (20 mEq total) by mouth daily. Patient not taking: Reported on 04/08/2015 02/07/15   Lorayne Marek, MD  triamcinolone cream (KENALOG) 0.1 % Apply topically 2 (two) times daily. 04/14/15   Lorayne Marek, MD   BP 162/74 mmHg  Pulse 73  Temp(Src) 97.8 F (36.6 C) (Oral)  Resp 15  SpO2  100% Physical Exam  Constitutional: She is oriented to person, place, and time. She appears well-developed and well-nourished.  Neurological: She is alert and oriented to person, place, and time.  Skin: Skin is warm and dry. Rash noted.  Extensive hyperpig patchy rash with blistering, pruritic. Nontender, no drainage.  Nursing note and vitals reviewed.   ED Course  Procedures (including critical care time) Labs Review Labs Reviewed - No data to display  Imaging Review No results found.   MDM   1. Scabies        Billy Fischer, MD 05/13/15 [phone]

## 2015-05-13 NOTE — ED Notes (Signed)
Pt reports     Symptoms  Of      Of  Both         Lower  Legs      X  8    Months       meds      Not  Working

## 2015-05-31 ENCOUNTER — Other Ambulatory Visit: Payer: Self-pay | Admitting: Internal Medicine

## 2015-05-31 DIAGNOSIS — J45909 Unspecified asthma, uncomplicated: Secondary | ICD-10-CM | POA: Diagnosis not present

## 2015-05-31 DIAGNOSIS — R21 Rash and other nonspecific skin eruption: Secondary | ICD-10-CM | POA: Diagnosis not present

## 2015-05-31 DIAGNOSIS — I10 Essential (primary) hypertension: Secondary | ICD-10-CM | POA: Diagnosis not present

## 2015-06-05 DIAGNOSIS — J4520 Mild intermittent asthma, uncomplicated: Secondary | ICD-10-CM | POA: Diagnosis not present

## 2015-06-05 DIAGNOSIS — R7309 Other abnormal glucose: Secondary | ICD-10-CM | POA: Diagnosis not present

## 2015-06-05 DIAGNOSIS — I10 Essential (primary) hypertension: Secondary | ICD-10-CM | POA: Diagnosis not present

## 2015-06-05 DIAGNOSIS — Z0000 Encounter for general adult medical examination without abnormal findings: Secondary | ICD-10-CM | POA: Diagnosis not present

## 2015-06-05 DIAGNOSIS — N2889 Other specified disorders of kidney and ureter: Secondary | ICD-10-CM | POA: Diagnosis not present

## 2015-06-22 DIAGNOSIS — R21 Rash and other nonspecific skin eruption: Secondary | ICD-10-CM | POA: Diagnosis not present

## 2015-06-22 DIAGNOSIS — L299 Pruritus, unspecified: Secondary | ICD-10-CM | POA: Diagnosis not present

## 2015-06-24 ENCOUNTER — Ambulatory Visit: Payer: Medicare Other | Admitting: Gastroenterology

## 2015-07-04 DIAGNOSIS — L97929 Non-pressure chronic ulcer of unspecified part of left lower leg with unspecified severity: Secondary | ICD-10-CM | POA: Diagnosis not present

## 2015-07-04 DIAGNOSIS — L97919 Non-pressure chronic ulcer of unspecified part of right lower leg with unspecified severity: Secondary | ICD-10-CM | POA: Diagnosis not present

## 2015-07-04 DIAGNOSIS — N2889 Other specified disorders of kidney and ureter: Secondary | ICD-10-CM | POA: Diagnosis not present

## 2015-07-28 ENCOUNTER — Ambulatory Visit: Payer: Medicare Other | Admitting: Gastroenterology

## 2015-08-28 ENCOUNTER — Other Ambulatory Visit: Payer: Self-pay | Admitting: Family Medicine

## 2015-09-15 ENCOUNTER — Other Ambulatory Visit: Payer: Self-pay | Admitting: *Deleted

## 2015-09-15 DIAGNOSIS — E785 Hyperlipidemia, unspecified: Secondary | ICD-10-CM

## 2015-12-10 NOTE — Telephone Encounter (Signed)
error 

## 2016-03-09 ENCOUNTER — Other Ambulatory Visit (HOSPITAL_COMMUNITY): Payer: Self-pay | Admitting: Interventional Radiology

## 2016-03-09 DIAGNOSIS — N2889 Other specified disorders of kidney and ureter: Secondary | ICD-10-CM

## 2016-10-07 ENCOUNTER — Emergency Department (HOSPITAL_COMMUNITY)
Admission: EM | Admit: 2016-10-07 | Discharge: 2016-10-07 | Disposition: A | Discharge status: Home / Self Care | Payer: Medicare Other | Attending: Emergency Medicine | Admitting: Emergency Medicine

## 2016-10-07 ENCOUNTER — Emergency Department (HOSPITAL_COMMUNITY): Payer: Medicare Other

## 2016-10-07 ENCOUNTER — Encounter (HOSPITAL_COMMUNITY): Payer: Self-pay | Admitting: Emergency Medicine

## 2016-10-07 DIAGNOSIS — R05 Cough: Secondary | ICD-10-CM

## 2016-10-07 DIAGNOSIS — J45909 Unspecified asthma, uncomplicated: Secondary | ICD-10-CM | POA: Diagnosis not present

## 2016-10-07 DIAGNOSIS — R059 Cough, unspecified: Secondary | ICD-10-CM

## 2016-10-07 DIAGNOSIS — N189 Chronic kidney disease, unspecified: Secondary | ICD-10-CM | POA: Diagnosis not present

## 2016-10-07 DIAGNOSIS — Z859 Personal history of malignant neoplasm, unspecified: Secondary | ICD-10-CM | POA: Insufficient documentation

## 2016-10-07 DIAGNOSIS — I129 Hypertensive chronic kidney disease with stage 1 through stage 4 chronic kidney disease, or unspecified chronic kidney disease: Secondary | ICD-10-CM | POA: Insufficient documentation

## 2016-10-07 DIAGNOSIS — R0789 Other chest pain: Secondary | ICD-10-CM | POA: Diagnosis not present

## 2016-10-07 DIAGNOSIS — Z91010 Allergy to peanuts: Secondary | ICD-10-CM | POA: Diagnosis not present

## 2016-10-07 DIAGNOSIS — R079 Chest pain, unspecified: Secondary | ICD-10-CM

## 2016-10-07 DIAGNOSIS — Z7982 Long term (current) use of aspirin: Secondary | ICD-10-CM | POA: Diagnosis not present

## 2016-10-07 LAB — BASIC METABOLIC PANEL
Anion gap: 12 (ref 5–15)
BUN: 12 mg/dL (ref 6–20)
CO2: 27 mmol/L (ref 22–32)
CREATININE: 0.7 mg/dL (ref 0.44–1.00)
Calcium: 9.5 mg/dL (ref 8.9–10.3)
Chloride: 98 mmol/L — ABNORMAL LOW (ref 101–111)
GFR calc Af Amer: >60 mL/min (ref >60)
Glucose, Bld: 92 mg/dL (ref 65–99)
POTASSIUM: 3.7 mmol/L (ref 3.5–5.1)
SODIUM: 137 mmol/L (ref 135–145)

## 2016-10-07 LAB — I-STAT TROPONIN, ED: Troponin i, poc: 0 ng/mL (ref 0.00–0.08)

## 2016-10-07 LAB — CBC
HEMATOCRIT: 38 % (ref 36.0–46.0)
Hemoglobin: 12.3 g/dL (ref 12.0–15.0)
MCH: 26.3 pg (ref 26.0–34.0)
MCHC: 32.4 g/dL (ref 30.0–36.0)
MCV: 81.2 fL (ref 78.0–100.0)
PLATELETS: 231 10*3/uL (ref 150–400)
RBC: 4.68 MIL/uL (ref 3.87–5.11)
RDW: 15 % (ref 11.5–15.5)
WBC: 5.2 10*3/uL (ref 4.0–10.5)

## 2016-10-07 MED ORDER — IPRATROPIUM BROMIDE 0.02 % IN SOLN
0.5000 mg | Freq: Once | RESPIRATORY_TRACT | Status: AC
  Administered 2016-10-07: 0.5 mg via RESPIRATORY_TRACT
  Filled 2016-10-07: qty 2.5

## 2016-10-07 MED ORDER — ALBUTEROL SULFATE (2.5 MG/3ML) 0.083% IN NEBU
5.0000 mg | INHALATION_SOLUTION | Freq: Once | RESPIRATORY_TRACT | Status: AC
  Administered 2016-10-07: 5 mg via RESPIRATORY_TRACT
  Filled 2016-10-07: qty 6

## 2016-10-07 MED ORDER — PREDNISONE 20 MG PO TABS: ORAL_TABLET | ORAL | 0 refills | Status: AC

## 2016-10-07 MED ORDER — PREDNISONE 20 MG PO TABS
60.0000 mg | ORAL_TABLET | Freq: Once | ORAL | Status: AC
  Administered 2016-10-07: 60 mg via ORAL
  Filled 2016-10-07: qty 3

## 2016-10-07 MED ORDER — AZITHROMYCIN 250 MG PO TABS: 250.0000 mg | ORAL_TABLET | Freq: Every day | ORAL | 0 refills | Status: AC

## 2016-10-07 NOTE — ED Notes (Signed)
Spoke with radiology she is next to go

## 2016-10-07 NOTE — Discharge Instructions (Signed)
Take the prescribed medication as directed.  Continue using your albuterol inhaler for wheezing. Follow-up with your primary care doctor. Return to the ED for new or worsening symptoms-- worsening pain, shortness of breath, etc.

## 2016-10-07 NOTE — ED Notes (Signed)
Pt back from radiology placed back on monitor

## 2016-10-07 NOTE — ED Provider Notes (Signed)
[facility] Provider Note   CSN: [id number] Arrival date & time: 10/07/16  1114     History   Chief Complaint Chief Complaint  Patient presents with  . Chest Pain    HPI Regina [name] is a 75 y.o. female.  The history is provided by the patient and medical records.  Chest Pain     75 y.o. F with hx of asthma, arthritis, CKD, depression, GERD, HLP, HTN, presenting to the ED for cough. She states developed last weekend and has been ongoing for the past 4 days. She reports cough is intermittently productive with thick, white sputum. Reports she has some tightness in the center of her chest which feels like her asthma. States for the past several years she feels that her asthma has been worsening, however does not feel that her doctor takes her seriously. She has no known cardiac history. She's never been a smoker.  States she uses home albuterol inhaler without relief.  She states she has had CAP twice in the past 6 years.  She did not receive a flu shot this year.  No sick contacts.  Past Medical History:  Diagnosis Date  . Arthritis   . Asthma   . Cancer ([place])   . Chronic kidney disease   . Depression   . Esophageal reflux   . High cholesterol   . Hypertension   . Left renal mass     Patient Active Problem List   Diagnosis Date Noted  . Renal mass, left   . Peripheral neuropathy ([place]) 02/13/2015  . Venous (peripheral) insufficiency 02/13/2015  . Palpable mass of neck 04/29/2014  . Cervical lymphadenopathy 04/12/2014  . Muscle spasm 04/12/2014  . Essential hypertension, benign 03/01/2014  . Osteopenia 03/01/2014  . Anemia 03/01/2014  . Other and unspecified hyperlipidemia 03/01/2014  . Unspecified asthma(493.90) 03/01/2014    History reviewed. No pertinent surgical history.  OB History    Gravida Para Term Preterm AB Living   7 6 6   1 6    SAB TAB Ectopic Multiple Live Births     1             Home Medications    Prior to Admission medications    Medication Sig Start Date End Date Taking? Authorizing Provider  acyclovir (ZOVIRAX) 200 MG capsule Take 1 capsule (200 mg total) by mouth daily. 04/14/15   Lorayne Marek, MD  albuterol (PROVENTIL HFA;VENTOLIN HFA) 108 (90 BASE) MCG/ACT inhaler Inhale 2 puffs into the lungs every 6 (six) hours as needed. For shortness of breath/wheezing 10/31/14   Lorayne Marek, MD  amitriptyline (ELAVIL) 25 MG tablet Take 1 tablet (25 mg total) by mouth at bedtime. 04/14/15   Lorayne Marek, MD  aspirin EC 81 MG tablet Take 81 mg by mouth daily.    Historical Provider, MD  atorvastatin (LIPITOR) 40 MG tablet Take 1 tablet (40 mg total) by mouth daily. Patient taking differently: Take 40 mg by mouth daily at 6 PM.  01/07/15   Lorayne Marek, MD  CALCIUM PO Take 1 tablet by mouth daily.     Historical Provider, MD  cholecalciferol (VITAMIN D) 1000 UNITS tablet Take 1,000 Units by mouth daily.    Historical Provider, MD  cyclobenzaprine (FLEXERIL) 5 MG tablet Take 1 tablet (5 mg total) by mouth daily as needed. For muscle spasms 06/06/14   Lorayne Marek, MD  famotidine (PEPCID) 20 MG tablet Take 1 tablet (20 mg total) by mouth daily. 04/14/15   Deepak  Advani, MD  furosemide (LASIX) 20 MG tablet Take 1 tablet (20 mg total) by mouth 2 (two) times daily. 02/07/15   Lorayne Marek, MD  gabapentin (NEURONTIN) 300 MG capsule Take 300 mg by mouth at bedtime.    Historical Provider, MD  HYDROcodone-acetaminophen (NORCO/VICODIN) 5-325 MG per tablet Take 1 tablet by mouth every 6 (six) hours as needed. Patient not taking: Reported on 04/08/2015 01/29/15   Montine Circle, PA-C  hydrocortisone 2.5 % cream Apply topically 2 (two) times daily. 04/08/15   Drenda Freeze, MD  losartan (COZAAR) 100 MG tablet TAKE 1 TABLET BY MOUTH DAILY 08/29/15   Boykin Nearing, MD  Menthol, Topical Analgesic, (GOLD BOND PAIN RELIEVING FOOT EX) Apply 1 application topically 2 (two) times daily.    Historical Provider, MD  permethrin (ELIMITE) 5 % cream  Use as directed and repeat in 1 week. 05/13/15   Billy Fischer, MD  potassium chloride SA (K-DUR,KLOR-CON) 20 MEQ tablet Take 1 tablet (20 mEq total) by mouth daily. Patient not taking: Reported on 04/08/2015 02/07/15   Lorayne Marek, MD  triamcinolone cream (KENALOG) 0.1 % Apply topically 2 (two) times daily. 04/14/15   Lorayne Marek, MD    Family History Family History  Problem Relation Age of Onset  . Cancer Mother   . Diabetes Father   . Heart disease Maternal Grandmother   . Asthma Maternal Grandfather   . Heart disease Maternal Grandfather     Social History Social History  Substance Use Topics  . Smoking status: Never Smoker  . Smokeless tobacco: Not on file  . Alcohol use No     Allergies   Peanuts [peanut oil]   Review of Systems Review of Systems  Cardiovascular: Positive for chest pain.  All other systems reviewed and are negative.    Physical Exam Updated Vital Signs BP 138/94 (BP Location: Right Arm)   Pulse 73   Temp 98.1 F (36.7 C) (Oral)   Resp 19   Ht 5\' 2"  (1.575 m)   Wt 65.8 kg   SpO2 100%   BMI 26.52 kg/m   Physical Exam  Constitutional: She is oriented to person, place, and time. She appears well-developed and well-nourished.  HENT:  Head: Normocephalic and atraumatic.  Mouth/Throat: Oropharynx is clear and moist.  Eyes: Conjunctivae and EOM are normal. Pupils are equal, round, and reactive to light.  Neck: Normal range of motion.  Cardiovascular: Normal rate, regular rhythm and normal heart sounds.   Pulmonary/Chest: Effort normal. No respiratory distress. She has no wheezes. She has rhonchi.  Rhonchi on right, wheezes throughout; no distress, speaking in full sentences without difficulty  Abdominal: Soft. Bowel sounds are normal.  Musculoskeletal: Normal range of motion.  Neurological: She is alert and oriented to person, place, and time.  Skin: Skin is warm and dry.  Psychiatric: She has a normal mood and affect.  Nursing note and  vitals reviewed.    ED Treatments / Results  Labs (all labs ordered are listed, but only abnormal results are displayed) Labs Reviewed  BASIC METABOLIC PANEL - Abnormal; Notable for the following:       Result Value   Chloride 98 (*)    All other components within normal limits  CBC  I-STAT TROPOININ, ED    EKG  EKG Interpretation  Date/Time:  Thursday October 07 2016 12:13:05 EDT Ventricular Rate:  67 PR Interval:  152 QRS Duration: 97 QT Interval:  528 QTC Calculation: 558 R Axis:   -47 Text  Interpretation:  Sinus rhythm Left anterior fascicular block Abnormal R-wave progression, early transition Nonspecific T abnormalities, lateral leads Prolonged QT interval Baseline wander in lead(s) V1 V5 Confirmed by Winfred Leeds  MD, SAM [phone]) on 10/07/2016 12:17:39 PM       Radiology Dg Chest 2 View  Result Date: 10/07/2016 CLINICAL DATA:  Chest pain,cough/sob since Saturday,,hx htn EXAM: CHEST  2 VIEW COMPARISON:  01/16/2015 FINDINGS: Lungs are mildly hyperinflated. There is mild streaky density likely in the lingula and best seen on the lateral view. No focal areas of consolidation. No pleural effusions or pulmonary edema. IMPRESSION: 1. Atelectasis versus scarring in the lingula. 2. No focal consolidations. Electronically Signed   By: Nolon Nations M.D.   On: 10/07/2016 13:10    Procedures Procedures (including critical care time)  Medications Ordered in ED Medications  albuterol (PROVENTIL) (2.5 MG/3ML) 0.083% nebulizer solution 5 mg (5 mg Nebulization Given 10/07/16 1159)  predniSONE (DELTASONE) tablet 60 mg (60 mg Oral Given 10/07/16 1158)  albuterol (PROVENTIL) (2.5 MG/3ML) 0.083% nebulizer solution 5 mg (5 mg Nebulization Given 10/07/16 1524)  ipratropium (ATROVENT) nebulizer solution 0.5 mg (0.5 mg Nebulization Given 10/07/16 1524)     Initial Impression / Assessment and Plan / ED Course  I have reviewed the triage vital signs and the nursing notes.  Pertinent  labs & imaging results that were available during my care of the patient were reviewed by me and considered in my medical decision making (see chart for details).  Clinical Course   75 year old female here with chest tightness and cough. States this is been ongoing for several days now. She is afebrile and nontoxic. Her vital stable on room air. She is in no acute respiratory distress. She does have some some rhonchi on the right and wheezes. She does not appear fluid overloaded. Workup pending. Albuterol and prednisone ordered.  Labwork is reassuring. Chest x-ray with scarring, no acute infiltrate. EKG without acute ischemia. States her symptoms are improving. She was given additional albuterol and Atrovent nebulizer treatment with continued improvement of her lung sounds. She was ambulatory in the ED and maintained oxygen saturation of 96% or higher. She remained without any signs of respiratory distress. Feel patient is stable for discharge. sx likely due to URI.  Feel ACS, PE, dissection, or other acute cardiac event less likely at this time.  Does report history of recurrent pneumonia, will cover with antibiotics. She was also given prednisone taper. Will continue using her albuterol at home. Follow-up with PCP.  Discussed plan with patient, she acknowledged understanding and agreed with plan of care.  Return precautions given for new or worsening symptoms.  Final Clinical Impressions(s) / ED Diagnoses   Final diagnoses:  Chest pain, unspecified type  Cough    New Prescriptions New Prescriptions   AZITHROMYCIN (ZITHROMAX) 250 MG TABLET    Take 1 tablet (250 mg total) by mouth daily. Take first 2 tablets together, then 1 every day until finished.   PREDNISONE (DELTASONE) 20 MG TABLET    Take 40 mg by mouth daily for 3 days, then 20mg  by mouth daily for 3 days, then 10mg  daily for 3 days     Larene Pickett, PA-C 10/07/16 [place], MD 10/07/16 1755

## 2016-10-07 NOTE — ED Provider Notes (Signed)
Patient reports that she is been coughing yellowish green sputum since March 2017. She became short of breath and developed chest pain worse with coughing onset 5 days ago. She feels much improved since treatment here and she feels that her breathing is normal presently. On exam she is elderly, chronically ill-appearing. No distress. Speaks in paragraphs lungs with diffuse scant rhonchi. Heart regular rate and rhythm   Orlie Dakin, MD 10/07/16 1515

## 2016-10-07 NOTE — ED Notes (Signed)
Ambulated patient with pulse Ox, reading is 96% O2.

## 2016-10-07 NOTE — ED Triage Notes (Addendum)
Pt states she has been having pain in the center of her chest that feels like her asthma. Pt state she was called from "nurse in [place] that told her she had a collapsed  lung. Pt has wheezing. Pt able to speak in complete sentences, in no acute distress. Pt also has dry non productive cough.

## 2016-10-07 NOTE — ED Notes (Signed)
Patient transported to X-ray 

## 2017-01-13 IMAGING — DX DG CHEST 2V
2 series · 2 of 2 positions shown · non-contrast
Comparison: 01/16/2015

CLINICAL DATA: Chest pain,cough/sob since [REDACTED],,hx htn

EXAM:
CHEST  2 VIEW

[w chest pa]
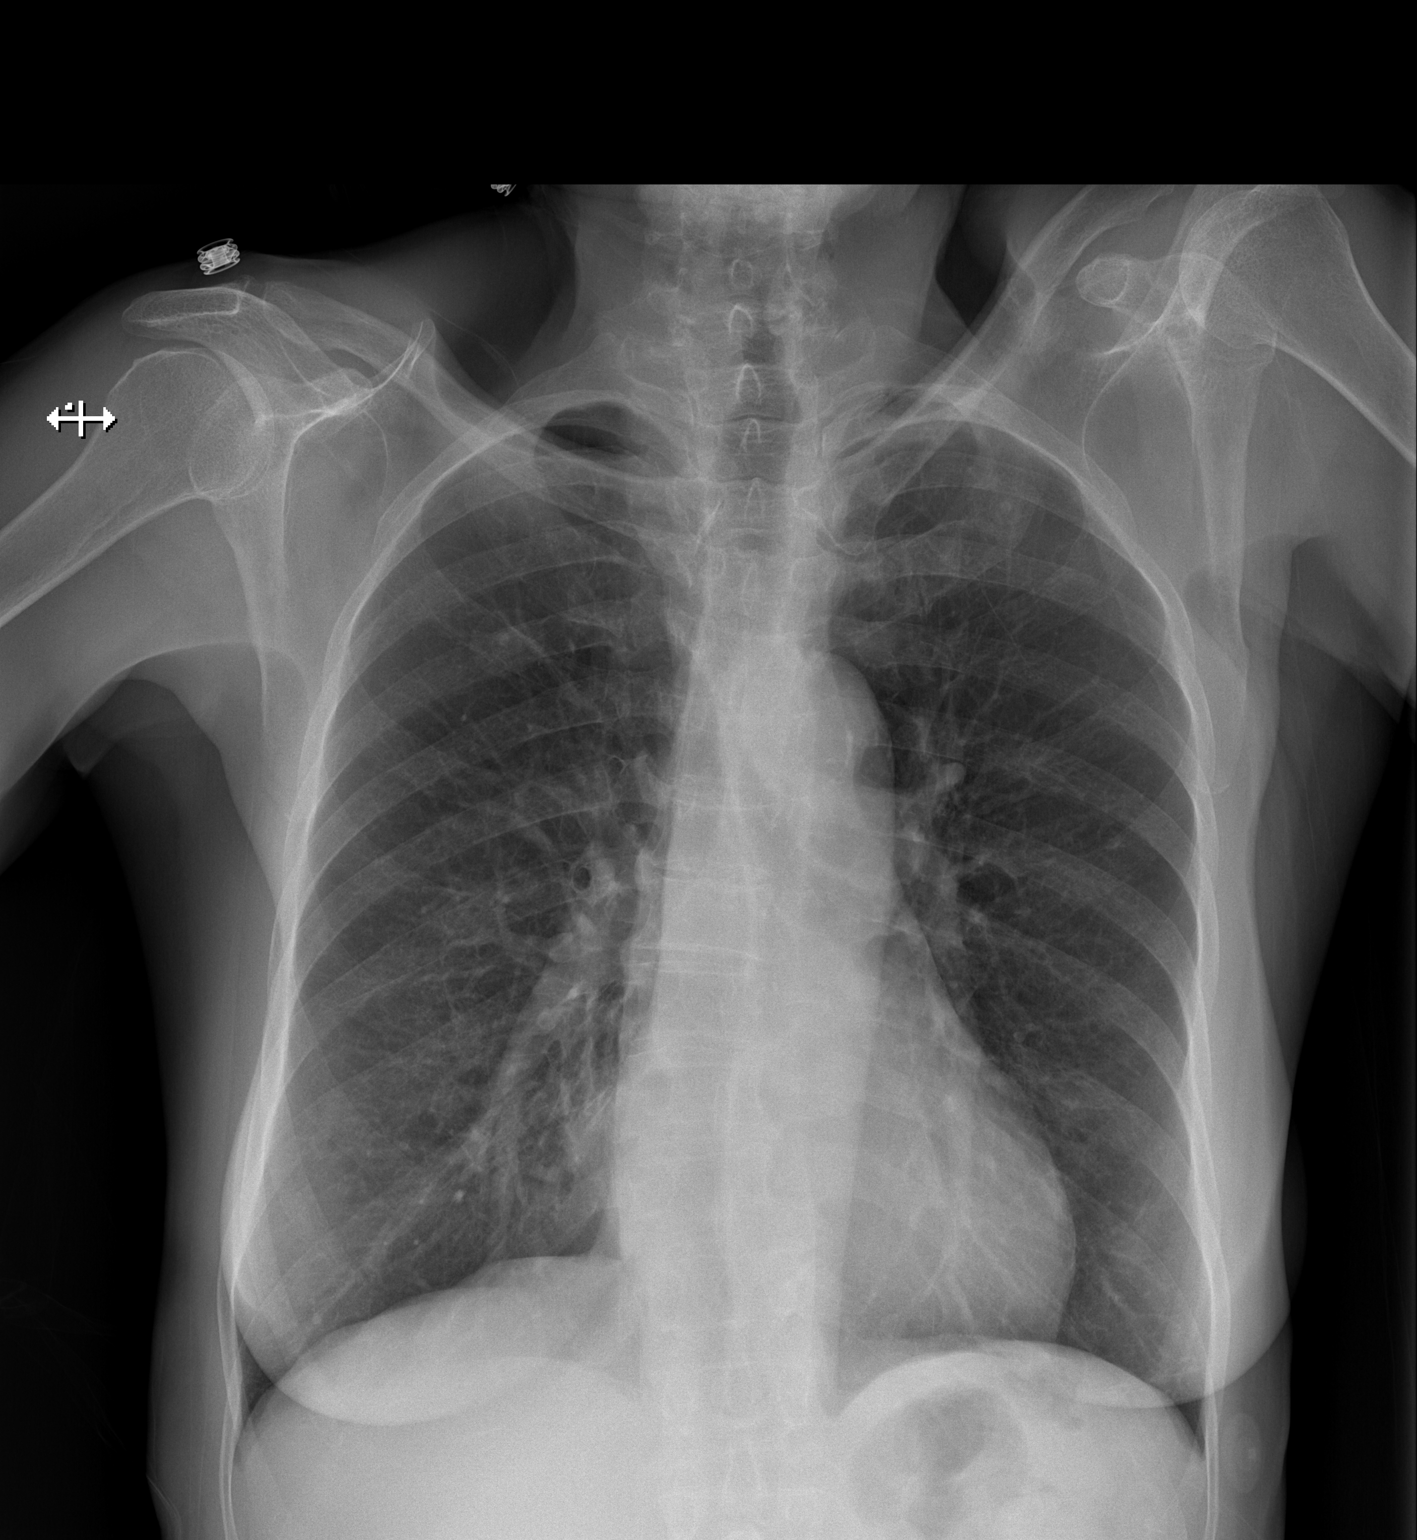

[w chest lat]
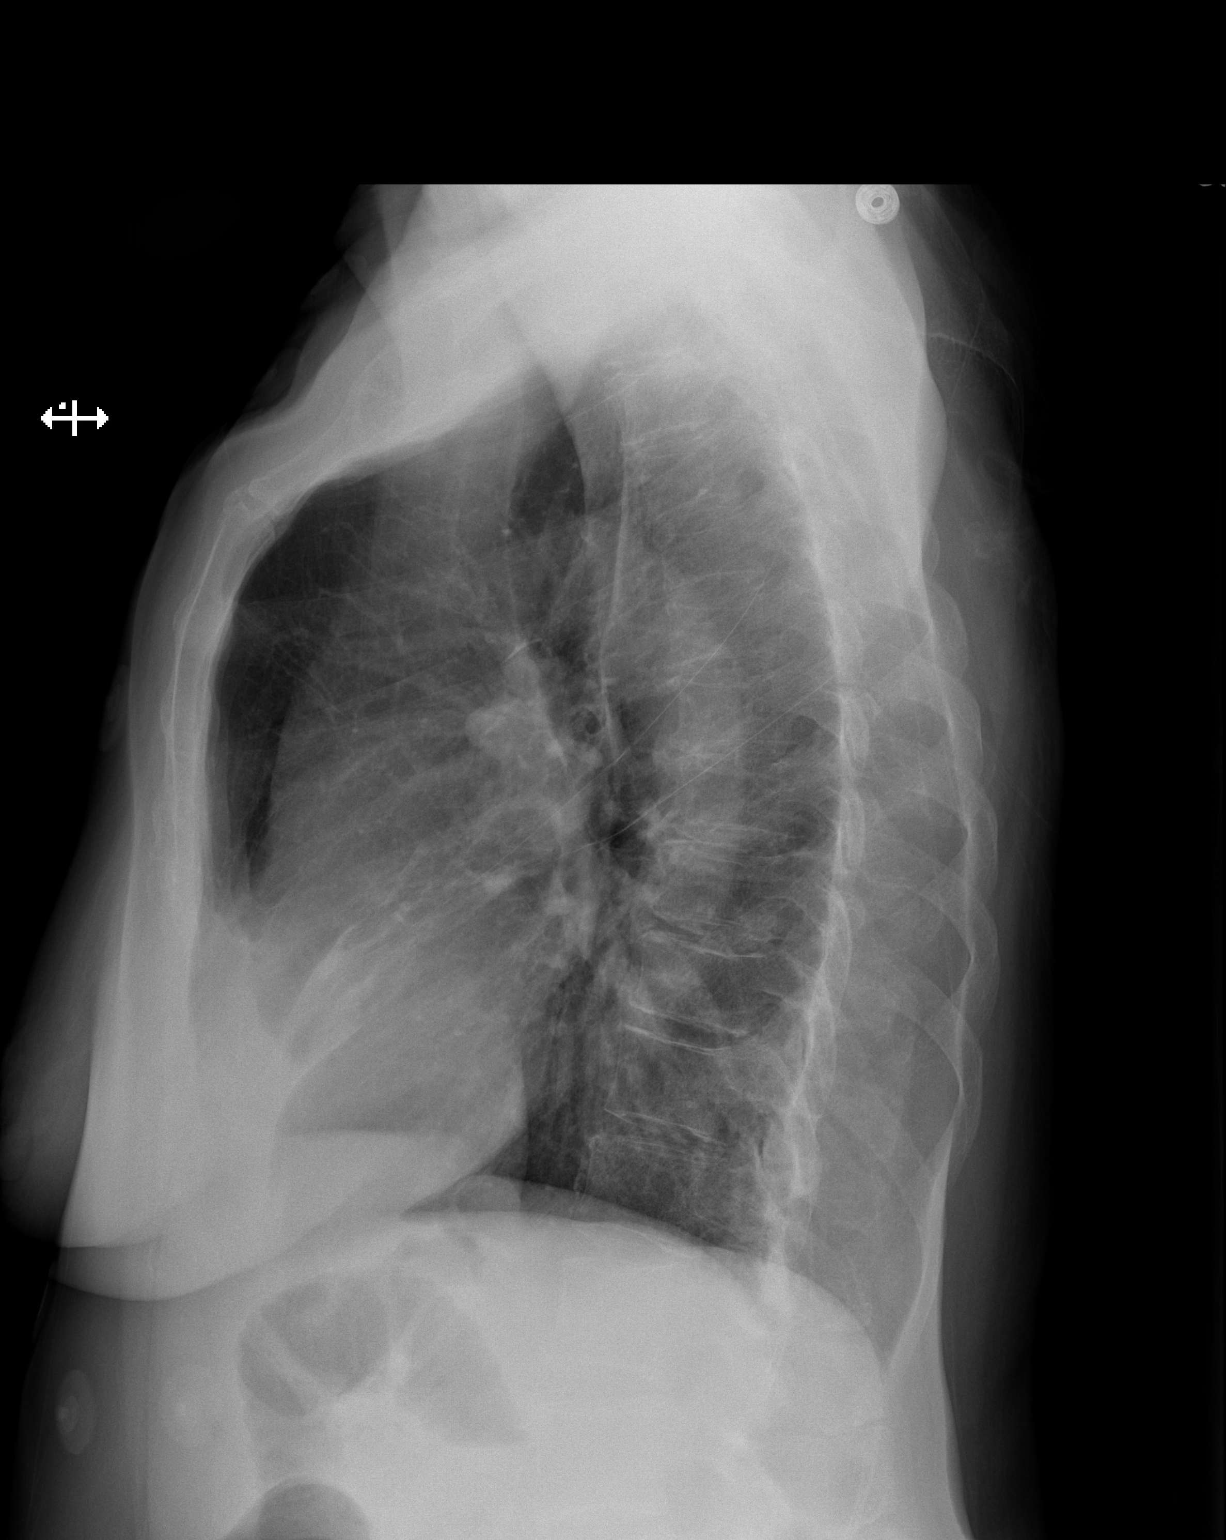

[2 of 2 positions shown; findings below may reference images not displayed]

FINDINGS: Lungs are mildly hyperinflated. There is mild streaky density likely
in the lingula and best seen on the lateral view. No focal areas of
consolidation. No pleural effusions or pulmonary edema.
IMPRESSION: 1. Atelectasis versus scarring in the lingula.
2. No focal consolidations.

## 2018-04-19 DEATH — deceased
